# Patient Record
Sex: Male | Born: 1975 | Race: White | Hispanic: No | Marital: Married | Smoking: Never smoker
Health system: Southern US, Community
[De-identification: ages and names within clinical notes are randomized; demographics above are authoritative.]

## PROBLEM LIST (undated history)

## (undated) DIAGNOSIS — G43909 Migraine, unspecified, not intractable, without status migrainosus: Secondary | ICD-10-CM

## (undated) HISTORY — DX: Migraine, unspecified, not intractable, without status migrainosus: G43.909

## (undated) HISTORY — PX: NO PAST SURGERIES: SHX2092

---

## 2019-06-26 ENCOUNTER — Ambulatory Visit (INDEPENDENT_AMBULATORY_CARE_PROVIDER_SITE_OTHER): Payer: 59 | Admitting: Physician Assistant

## 2019-06-26 ENCOUNTER — Other Ambulatory Visit: Payer: Self-pay

## 2019-06-26 ENCOUNTER — Encounter: Payer: Self-pay | Admitting: Physician Assistant

## 2019-06-26 VITALS — BP 122/84 | HR 76 | Temp 97.7°F | Ht 67.0 in | Wt 167.8 lb

## 2019-06-26 DIAGNOSIS — Z136 Encounter for screening for cardiovascular disorders: Secondary | ICD-10-CM | POA: Diagnosis not present

## 2019-06-26 DIAGNOSIS — G43809 Other migraine, not intractable, without status migrainosus: Secondary | ICD-10-CM | POA: Diagnosis not present

## 2019-06-26 DIAGNOSIS — M546 Pain in thoracic spine: Secondary | ICD-10-CM

## 2019-06-26 DIAGNOSIS — G8929 Other chronic pain: Secondary | ICD-10-CM | POA: Diagnosis not present

## 2019-06-26 DIAGNOSIS — Z114 Encounter for screening for human immunodeficiency virus [HIV]: Secondary | ICD-10-CM

## 2019-06-26 DIAGNOSIS — Z13220 Encounter for screening for lipoid disorders: Secondary | ICD-10-CM | POA: Diagnosis not present

## 2019-06-26 DIAGNOSIS — G43909 Migraine, unspecified, not intractable, without status migrainosus: Secondary | ICD-10-CM | POA: Insufficient documentation

## 2019-06-26 DIAGNOSIS — Z0000 Encounter for general adult medical examination without abnormal findings: Secondary | ICD-10-CM

## 2019-06-26 DIAGNOSIS — E663 Overweight: Secondary | ICD-10-CM | POA: Insufficient documentation

## 2019-06-26 MED ORDER — DICLOFENAC SODIUM 75 MG PO TBEC
75.0000 mg | DELAYED_RELEASE_TABLET | Freq: Two times a day (BID) | ORAL | 1 refills | Status: DC
Start: 2019-06-26 — End: 2020-07-08

## 2019-06-26 MED ORDER — RIZATRIPTAN BENZOATE 10 MG PO TABS
10.0000 mg | ORAL_TABLET | ORAL | 2 refills | Status: DC | PRN
Start: 2019-06-26 — End: 2020-02-26

## 2019-06-26 NOTE — Patient Instructions (Signed)
It was great to see you!  Rizatriptan and diclofenac have been sent to the pharmacy.  Please go to the lab for blood work.   Our office will call you with your results unless you have chosen to receive results via MyChart.  If your blood work is normal we will follow-up each year for physicals and as scheduled for chronic medical problems.  If anything is abnormal we will treat accordingly and get you in for a follow-up.  Take care,  [facility] Maintenance, Male Adopting a healthy lifestyle and getting preventive care are important in promoting health and wellness. Ask your health care provider about:  The right schedule for you to have regular tests and exams.  Things you can do on your own to prevent diseases and keep yourself healthy. What should I know about diet, weight, and exercise? Eat a healthy diet   Eat a diet that includes plenty of vegetables, fruits, low-fat dairy products, and lean protein.  Do not eat a lot of foods that are high in solid fats, added sugars, or sodium. Maintain a healthy weight Body mass index (BMI) is a measurement that can be used to identify possible weight problems. It estimates body fat based on height and weight. Your health care provider can help determine your BMI and help you achieve or maintain a healthy weight. Get regular exercise Get regular exercise. This is one of the most important things you can do for your health. Most adults should:  Exercise for at least 150 minutes each week. The exercise should increase your heart rate and make you sweat (moderate-intensity exercise).  Do strengthening exercises at least twice a week. This is in addition to the moderate-intensity exercise.  Spend less time sitting. Even light physical activity can be beneficial. Watch cholesterol and blood lipids Have your blood tested for lipids and cholesterol at 43 years of age, then have this test every 5 years. You may need to have your  cholesterol levels checked more often if:  Your lipid or cholesterol levels are high.  You are older than 43 years of age.  You are at high risk for heart disease. What should I know about cancer screening? Many types of cancers can be detected early and may often be prevented. Depending on your health history and family history, you may need to have cancer screening at various ages. This may include screening for:  Colorectal cancer.  Prostate cancer.  Skin cancer.  Lung cancer. What should I know about heart disease, diabetes, and high blood pressure? Blood pressure and heart disease  High blood pressure causes heart disease and increases the risk of stroke. This is more likely to develop in people who have high blood pressure readings, are of African descent, or are overweight.  Talk with your health care provider about your target blood pressure readings.  Have your blood pressure checked: ? Every 3-5 years if you are 8-54 years of age. ? Every year if you are 28 years old or older.  If you are between the ages of 84 and 5 and are a current or former smoker, ask your health care provider if you should have a one-time screening for abdominal aortic aneurysm (AAA). Diabetes Have regular diabetes screenings. This checks your fasting blood sugar level. Have the screening done:  Once every three years after age 9 if you are at a normal weight and have a low risk for diabetes.  More often and at a younger  age if you are overweight or have a high risk for diabetes. What should I know about preventing infection? Hepatitis B If you have a higher risk for hepatitis B, you should be screened for this virus. Talk with your health care provider to find out if you are at risk for hepatitis B infection. Hepatitis C Blood testing is recommended for:  Everyone born from [birth year] through 1964.  Anyone with known risk factors for hepatitis C. Sexually transmitted infections (STIs)  You  should be screened each year for STIs, including gonorrhea and chlamydia, if: ? You are sexually active and are younger than 43 years of age. ? You are older than 43 years of age and your health care provider tells you that you are at risk for this type of infection. ? Your sexual activity has changed since you were last screened, and you are at increased risk for chlamydia or gonorrhea. Ask your health care provider if you are at risk.  Ask your health care provider about whether you are at high risk for HIV. Your health care provider may recommend a prescription medicine to help prevent HIV infection. If you choose to take medicine to prevent HIV, you should first get tested for HIV. You should then be tested every 3 months for as long as you are taking the medicine. Follow these instructions at home: Lifestyle  Do not use any products that contain nicotine or tobacco, such as cigarettes, e-cigarettes, and chewing tobacco. If you need help quitting, ask your health care provider.  Do not use street drugs.  Do not share needles.  Ask your health care provider for help if you need support or information about quitting drugs. Alcohol use  Do not drink alcohol if your health care provider tells you not to drink.  If you drink alcohol: ? Limit how much you have to 0-2 drinks a day. ? Be aware of how much alcohol is in your drink. In the [place] one drink equals one 12 oz bottle of beer (355 mL), one 5 oz glass of wine (148 mL), or one 1 oz glass of hard liquor (44 mL). General instructions  Schedule regular health, dental, and eye exams.  Stay current with your vaccines.  Tell your health care provider if: ? You often feel depressed. ? You have ever been abused or do not feel safe at home. Summary  Adopting a healthy lifestyle and getting preventive care are important in promoting health and wellness.  Follow your health care provider's instructions about healthy diet, exercising, and  getting tested or screened for diseases.  Follow your health care provider's instructions on monitoring your cholesterol and blood pressure. This information is not intended to replace advice given to you by your health care provider. Make sure you discuss any questions you have with your health care provider. Document Revised: 01/31/2018 Document Reviewed: 01/31/2018 Elsevier Patient Education  2020 [employer].

## 2019-06-26 NOTE — Progress Notes (Signed)
SCRIBE STATEMENT  Subjective:    Antonio Santiago is a 44 y.o. male and is here for a comprehensive physical exam.  HPI  Health Maintenance Due  Topic Date Due  . HIV Screening  Never done    Acute Concerns: None  Chronic Issues: Migraines -- has had issues since a child. Most recent rx from [place] was a -triptan, and he thinks that it may be rizatriptan. Takes this medication rarely. Sometimes finds that if he takes this medication too often can cause worsening symptoms so sometimes he doesn't take any medication. Denies changes in vision, numbness, tingling. Back pain -- long history of this. Sometimes gets b/l sciatic pain. Was getting series of injections in [place] that helped significantly. Will occassionally take voltaren orally.  Health Maintenance: Immunizations -- UTD Diet -- healthy diet Caffeine intake -- 3 coffees daily Sleep habits -- good sleep Exercise -- started to do exercise regularly with wife Weight -- Weight: 167 lb 12.8 oz (76.1 kg)  Normal weight for him Weight history Wt Readings from Last 10 Encounters:  06/26/19 167 lb 12.8 oz (76.1 kg)  Body mass index is 26.28 kg/m. Mood -- stable Tobacco use -- none Alcohol use --- rare  Depression screen PHQ 2/9 06/26/2019  Decreased Interest 0  Down, Depressed, Hopeless 0  PHQ - 2 Score 0     Other providers/specialists: Patient Care Team: Jarold Motto, [place] as PCP - General (Physician Assistant)   PMHx, SurgHx, SocialHx, Medications, and Allergies were reviewed in the Visit Navigator and updated as appropriate.   History reviewed. No pertinent past medical history.  History reviewed. No pertinent surgical history.   Family History  Problem Relation Age of Onset  . Esophageal cancer Father   . Stomach cancer Father     Social History   Tobacco Use  . Smoking status: Never Smoker  . Smokeless tobacco: Never Used  Substance Use Topics  . Alcohol use: Yes    Comment: very rare  . Drug use:  Not on file    Review of Systems:   Review of Systems  Constitutional: Negative for chills, fever, malaise/fatigue and weight loss.  HENT: Negative for hearing loss, sinus pain and sore throat.   Respiratory: Negative for cough and hemoptysis.   Cardiovascular: Negative for chest pain, palpitations, leg swelling and PND.  Gastrointestinal: Negative for abdominal pain, constipation, diarrhea, heartburn, nausea and vomiting.  Genitourinary: Negative for dysuria, frequency and urgency.  Musculoskeletal: Negative for back pain, myalgias and neck pain.  Skin: Negative for itching and rash.  Neurological: Negative for dizziness, tingling, seizures and headaches.  Endo/Heme/Allergies: Negative for polydipsia.  Psychiatric/Behavioral: Negative for depression. The patient is not nervous/anxious.       Objective:   Vitals:   06/26/19 1510  BP: 122/84  Pulse: 76  Temp: 97.7 F (36.5 C)  SpO2: 98%   Body mass index is 26.28 kg/m.  General Appearance:  Alert, cooperative, no distress, appears stated age  Head:  Normocephalic, without obvious abnormality, atraumatic  Eyes:  PERRL, conjunctiva/corneas clear, EOM's intact, fundi benign, both eyes       Ears:  Normal TM's and external ear canals, both ears  Nose: Nares normal, septum midline, mucosa normal, no drainage    or sinus tenderness  Throat: Lips, mucosa, and tongue normal; teeth and gums normal  Neck: Supple, symmetrical, trachea midline, no adenopathy; thyroid:  No enlargement/tenderness/nodules; no carotit bruit or JVD  Back:   Symmetric, no curvature, ROM normal, no CVA tenderness  Lungs:   Clear to auscultation bilaterally, respirations unlabored  Chest wall:  No tenderness or deformity  Heart:  Regular rate and rhythm, S1 and S2 normal, no murmur, rub   or gallop  Abdomen:   Soft, non-tender, bowel sounds active all four quadrants, no masses, no organomegaly  Extremities: Extremities normal, atraumatic, no cyanosis or edema   Prostate: Not done.   Skin: Skin color, texture, turgor normal, no rashes or lesions  Lymph nodes: Cervical, supraclavicular, and axillary nodes normal  Neurologic: CNII-XII grossly intact. Normal strength, sensation and reflexes throughout   No results found for this or any previous visit.  Assessment/Plan:   Antonio Santiago was seen today for new patient (initial visit).  Diagnoses and all orders for this visit:  Routine physical examination Today patient counseled on age appropriate routine health concerns for screening and prevention, each reviewed and up to date or declined. Immunizations reviewed and up to date or declined. Labs ordered and reviewed. Risk factors for depression reviewed and negative. Hearing function and visual acuity are intact. ADLs screened and addressed as needed. Functional ability and level of safety reviewed and appropriate. Education, counseling and referrals performed based on assessed risks today. Patient provided with a copy of personalized plan for preventive services. -     Urinalysis, Routine w reflex microscopic  Encounter for lipid screening for cardiovascular disease -     Lipid panel  Other migraine without status migrainosus, not intractable Denies any significant changes to his migraines. Will refill his rizatriptan. Follow-up if becomes uncontrolled.  Chronic midline thoracic back pain No red flags. Denies any significant changes to his symptoms. Will refill voltaren. -     CBC with Differential/Platelet -     Comprehensive metabolic panel -     TSH -     Urinalysis, Routine w reflex microscopic  Screening for HIV (human immunodeficiency virus) -     HIV Antibody (routine testing w rflx)  Overweight Work on diet and exercise as able.  Other orders -     rizatriptan (MAXALT) 10 MG tablet; Take 1 tablet (10 mg total) by mouth as needed for migraine. May repeat in 2 hours if needed -     diclofenac (VOLTAREN) 75 MG EC tablet; Take 1 tablet (75  mg total) by mouth 2 (two) times daily.   Well Adult Exam: Labs ordered: Yes. Patient counseling was done. See below for items discussed. Discussed the patient's BMI.  The BMI is not in the acceptable range; BMI management plan is completed Follow up in one year.  Patient Counseling: [x]   Nutrition: Stressed importance of moderation in sodium/caffeine intake, saturated fat and cholesterol, caloric balance, sufficient intake of fresh fruits, vegetables, and fiber.  [x]   Stressed the importance of regular exercise.   []   Substance Abuse: Discussed cessation/primary prevention of tobacco, alcohol, or other drug use; driving or other dangerous activities under the influence; availability of treatment for abuse.   [x]   Injury prevention: Discussed safety belts, safety helmets, smoke detector, smoking near bedding or upholstery.   []   Sexuality: Discussed sexually transmitted diseases, partner selection, use of condoms, avoidance of unintended pregnancy  and contraceptive alternatives.   [x]   Dental health: Discussed importance of regular tooth brushing, flossing, and dental visits.  [x]   Health maintenance and immunizations reviewed. Please refer to Health maintenance section.    CMA or LPN served as scribe during this visit. History, Physical, and Plan performed by medical provider. The above documentation has been reviewed and  is accurate and complete.  Jarold Motto, PA-C [facility]

## 2019-06-27 LAB — CBC WITH DIFFERENTIAL/PLATELET
Basophils Absolute: 0.1 10*3/uL (ref 0.0–0.1)
Basophils Relative: 1.3 % (ref 0.0–3.0)
Eosinophils Absolute: 0.1 10*3/uL (ref 0.0–0.7)
Eosinophils Relative: 0.7 % (ref 0.0–5.0)
HCT: 43.9 % (ref 39.0–52.0)
Hemoglobin: 15.6 g/dL (ref 13.0–17.0)
Lymphocytes Relative: 33.5 % (ref 12.0–46.0)
Lymphs Abs: 2.8 10*3/uL (ref 0.7–4.0)
MCHC: 35.6 g/dL (ref 30.0–36.0)
MCV: 89.9 fl (ref 78.0–100.0)
Monocytes Absolute: 0.7 10*3/uL (ref 0.1–1.0)
Monocytes Relative: 7.9 % (ref 3.0–12.0)
Neutro Abs: 4.6 10*3/uL (ref 1.4–7.7)
Neutrophils Relative %: 56.6 % (ref 43.0–77.0)
Platelets: 236 10*3/uL (ref 150.0–400.0)
RBC: 4.88 Mil/uL (ref 4.22–5.81)
RDW: 12.9 % (ref 11.5–15.5)
WBC: 8.2 10*3/uL (ref 4.0–10.5)

## 2019-06-27 LAB — COMPREHENSIVE METABOLIC PANEL
ALT: 14 U/L (ref 0–53)
AST: 15 U/L (ref 0–37)
Albumin: 4.6 g/dL (ref 3.5–5.2)
Alkaline Phosphatase: 64 U/L (ref 39–117)
BUN: 12 mg/dL (ref 6–23)
CO2: 30 mEq/L (ref 19–32)
Calcium: 9.4 mg/dL (ref 8.4–10.5)
Chloride: 103 mEq/L (ref 96–112)
Creatinine, Ser: 0.89 mg/dL (ref 0.40–1.50)
GFR: 93.33 mL/min (ref >60.00)
Glucose, Bld: 93 mg/dL (ref 70–99)
Potassium: 4.3 mEq/L (ref 3.5–5.1)
Sodium: 138 mEq/L (ref 135–145)
Total Bilirubin: 0.7 mg/dL (ref 0.2–1.2)
Total Protein: 7.3 g/dL (ref 6.0–8.3)

## 2019-06-27 LAB — LIPID PANEL
Cholesterol: 174 mg/dL (ref 0–200)
HDL: 41.8 mg/dL (ref >39.00)
NonHDL: 132.05
Total CHOL/HDL Ratio: 4
Triglycerides: 225 mg/dL — ABNORMAL HIGH (ref 0.0–149.0)
VLDL: 45 mg/dL — ABNORMAL HIGH (ref 0.0–40.0)

## 2019-06-27 LAB — URINALYSIS, ROUTINE W REFLEX MICROSCOPIC
Bilirubin Urine: NEGATIVE
Hgb urine dipstick: NEGATIVE
Ketones, ur: NEGATIVE
Leukocytes,Ua: NEGATIVE
Nitrite: NEGATIVE
RBC / HPF: NONE SEEN (ref 0–?)
Specific Gravity, Urine: 1.015 (ref 1.000–1.030)
Total Protein, Urine: NEGATIVE
Urine Glucose: NEGATIVE
Urobilinogen, UA: 0.2 (ref 0.0–1.0)
WBC, UA: NONE SEEN (ref 0–?)
pH: 7.5 (ref 5.0–8.0)

## 2019-06-27 LAB — TSH: TSH: 0.65 u[IU]/mL (ref 0.35–4.50)

## 2019-06-27 LAB — HIV ANTIBODY (ROUTINE TESTING W REFLEX): HIV 1&2 Ab, 4th Generation: NONREACTIVE

## 2019-06-27 LAB — LDL CHOLESTEROL, DIRECT: Direct LDL: 94 mg/dL

## 2020-01-14 ENCOUNTER — Ambulatory Visit: Payer: 59 | Admitting: Physician Assistant

## 2020-01-14 DIAGNOSIS — Z0289 Encounter for other administrative examinations: Secondary | ICD-10-CM

## 2020-01-14 NOTE — Progress Notes (Deleted)
Antonio Santiago is a 44 y.o. male here for a new problem.  I acted as a Neurosurgeon for [employer], PA-C [employer], LPN   History of Present Illness:   No chief complaint on file.   HPI Back pain    No past medical history on file.   Social History   Tobacco Use  . Smoking status: Never Smoker  . Smokeless tobacco: Never Used  Substance Use Topics  . Alcohol use: Yes    Comment: very rare  . Drug use: Not on file    No past surgical history on file.  Family History  Problem Relation Age of Onset  . Esophageal cancer Father   . Stomach cancer Father     No Known Allergies  Current Medications:   Current Outpatient Medications:  .  diclofenac (VOLTAREN) 75 MG EC tablet, Take 1 tablet (75 mg total) by mouth 2 (two) times daily., Disp: 30 tablet, Rfl: 1 .  rizatriptan (MAXALT) 10 MG tablet, Take 1 tablet (10 mg total) by mouth as needed for migraine. May repeat in 2 hours if needed, Disp: 10 tablet, Rfl: 2   Review of Systems:   ROS  Vitals:   There were no vitals filed for this visit.   There is no height or weight on file to calculate BMI.  Physical Exam:   Physical Exam  Results for orders placed or performed in visit on 06/26/19  CBC with Differential/Platelet  Result Value Ref Range   WBC 8.2 4.0 - 10.5 K/uL   RBC 4.88 4.22 - 5.81 Mil/uL   Hemoglobin 15.6 13.0 - 17.0 g/dL   HCT 42.3 39 - 52 %   MCV 89.9 78.0 - 100.0 fl   MCHC 35.6 30.0 - 36.0 g/dL   RDW 53.6 14.4 - 31.5 %   Platelets 236.0 150 - 400 K/uL   Neutrophils Relative % 56.6 43 - 77 %   Lymphocytes Relative 33.5 12 - 46 %   Monocytes Relative 7.9 3 - 12 %   Eosinophils Relative 0.7 0 - 5 %   Basophils Relative 1.3 0 - 3 %   Neutro Abs 4.6 1.4 - 7.7 K/uL   Lymphs Abs 2.8 0.7 - 4.0 K/uL   Monocytes Absolute 0.7 0.1 - 1.0 K/uL   Eosinophils Absolute 0.1 0.0 - 0.7 K/uL   Basophils Absolute 0.1 0.0 - 0.1 K/uL  Comprehensive metabolic panel  Result Value Ref Range   Sodium 138  135 - 145 mEq/L   Potassium 4.3 3.5 - 5.1 mEq/L   Chloride 103 96 - 112 mEq/L   CO2 30 19 - 32 mEq/L   Glucose, Bld 93 70 - 99 mg/dL   BUN 12 6 - 23 mg/dL   Creatinine, Ser 4.00 0.40 - 1.50 mg/dL   Total Bilirubin 0.7 0.2 - 1.2 mg/dL   Alkaline Phosphatase 64 39 - 117 U/L   AST 15 0 - 37 U/L   ALT 14 0 - 53 U/L   Total Protein 7.3 6.0 - 8.3 g/dL   Albumin 4.6 3.5 - 5.2 g/dL   GFR 86.76 >19.50 mL/min   Calcium 9.4 8.4 - 10.5 mg/dL  TSH  Result Value Ref Range   TSH 0.65 0.35 - 4.50 uIU/mL  Lipid panel  Result Value Ref Range   Cholesterol 174 0 - 200 mg/dL   Triglycerides 932.6 (H) 0 - 149 mg/dL   HDL 71.24 >58.09 mg/dL   VLDL 98.3 (H) 0.0 - 38.2 mg/dL  Total CHOL/HDL Ratio 4    NonHDL 132.05   Urinalysis, Routine w reflex microscopic  Result Value Ref Range   Color, Urine YELLOW Yellow;Lt. Yellow;Straw;Dark Yellow;Amber;Green;Red;Brown   APPearance CLEAR Clear;Turbid;Slightly Cloudy;Cloudy   Specific Gravity, Urine 1.015 1.000 - 1.030   pH 7.5 5.0 - 8.0   Total Protein, Urine NEGATIVE Negative   Urine Glucose NEGATIVE Negative   Ketones, ur NEGATIVE Negative   Bilirubin Urine NEGATIVE Negative   Hgb urine dipstick NEGATIVE Negative   Urobilinogen, UA 0.2 0.0 - 1.0   Leukocytes,Ua NEGATIVE Negative   Nitrite NEGATIVE Negative   WBC, UA none seen 0-2/hpf   RBC / HPF none seen 0-2/hpf  HIV Antibody (routine testing w rflx)  Result Value Ref Range   HIV 1&2 Ab, 4th Generation NON-REACTIVE NON-REACTI  LDL cholesterol, direct  Result Value Ref Range   Direct LDL 94.0 mg/dL    Assessment and Plan:   There are no diagnoses linked to this encounter.  . Reviewed expectations re: course of current medical issues. . Discussed self-management of symptoms. . Outlined signs and symptoms indicating need for more acute intervention. . Patient verbalized understanding and all questions were answered. . See orders for this visit as documented in the electronic medical  record. . Patient received an After-Visit Summary.  ***  Jarold Motto, PA-C

## 2020-01-21 ENCOUNTER — Encounter: Payer: Self-pay | Admitting: Physician Assistant

## 2020-01-21 ENCOUNTER — Ambulatory Visit (INDEPENDENT_AMBULATORY_CARE_PROVIDER_SITE_OTHER): Payer: 59 | Admitting: Physician Assistant

## 2020-01-21 ENCOUNTER — Other Ambulatory Visit: Payer: Self-pay

## 2020-01-21 VITALS — BP 121/82 | HR 77 | Temp 97.6°F | Ht 67.0 in | Wt 171.4 lb

## 2020-01-21 DIAGNOSIS — M549 Dorsalgia, unspecified: Secondary | ICD-10-CM | POA: Diagnosis not present

## 2020-01-21 DIAGNOSIS — R42 Dizziness and giddiness: Secondary | ICD-10-CM

## 2020-01-21 DIAGNOSIS — G8929 Other chronic pain: Secondary | ICD-10-CM

## 2020-01-21 MED ORDER — PREDNISONE 20 MG PO TABS: 40.0000 mg | ORAL_TABLET | Freq: Every day | ORAL | 0 refills | Status: DC

## 2020-01-21 NOTE — Progress Notes (Signed)
Antonio Santiago is a 44 y.o. male here for a new problem.  History of Present Illness:   Chief Complaint  Patient presents with   Back Pain    Pt says that he had had back pain before, and had restarted 3-5 days ago. he has taken Voltaren tab atleast 30 minutes ago. He says it is not helping.   Dizziness    Pt says that when he lays down, the room spins. Starting a couple of weeks ago.    HPI  Chronic back pain Patient reports that about 7 years ago in [place] he had MRI of complete spine that showed possible herniated disc in cervical, thoracic and lumbar spine. Underwent spinal injections and pain has been overall well controlled since that time. However, about 3-5 days ago his pain has returned. It is mostly in lower back, R side radiating down leg. He denies: fever, chills, weakness, numbness/tingling, bowel/bladder incontinence. He has tried topical voltaren and advil with mild relief. He is also seeing a chiropractor for laser therapy. Current pain is 8/10.  Vertigo A couple weeks ago he started having sensation of the room spinning when laying down and turning his head to the right. Denies: vision changes, nausea, vomiting, head trauma. This has happened to him in the past and he went to PT and had exercises and resolution of symptoms.    History reviewed. No pertinent past medical history.   Social History   Tobacco Use   Smoking status: Never Smoker   Smokeless tobacco: Never Used  Substance Use Topics   Alcohol use: Yes    Comment: very rare   Drug use: Not on file    History reviewed. No pertinent surgical history.  Family History  Problem Relation Age of Onset   Esophageal cancer Father    Stomach cancer Father     No Known Allergies  Current Medications:   Current Outpatient Medications:    diclofenac (VOLTAREN) 75 MG EC tablet, Take 1 tablet (75 mg total) by mouth 2 (two) times daily., Disp: 30 tablet, Rfl: 1   rizatriptan (MAXALT) 10 MG tablet,  Take 1 tablet (10 mg total) by mouth as needed for migraine. May repeat in 2 hours if needed, Disp: 10 tablet, Rfl: 2   predniSONE (DELTASONE) 20 MG tablet, Take 2 tablets (40 mg total) by mouth daily., Disp: 10 tablet, Rfl: 0   Review of Systems:   ROS  Negative unless otherwise specified per HPI.  Vitals:   Vitals:   01/21/20 1446  BP: 121/82  Pulse: 77  Temp: 97.6 F (36.4 C)  TempSrc: Temporal  SpO2: 100%  Weight: 171 lb 6.4 oz (77.7 kg)  Height: 5\' 7"  (1.702 m)     Body mass index is 26.85 kg/m.  Physical Exam:   Physical Exam Vitals and nursing note reviewed.  Constitutional:      General: He is not in acute distress.    Appearance: He is well-developed. He is not ill-appearing or toxic-appearing.  Cardiovascular:     Rate and Rhythm: Normal rate and regular rhythm.     Pulses: Normal pulses.     Heart sounds: Normal heart sounds, S1 normal and S2 normal.     Comments: No LE edema Pulmonary:     Effort: Pulmonary effort is normal.     Breath sounds: Normal breath sounds.  Musculoskeletal:     Comments: Decreased ROM 2/2 pain with flexion/extension, lateral side bends, and rotation. Reproducible tenderness with deep palpation to bilateral  paraspinal muscles. Bony tenderness to middle thoracic. No evidence of erythema, rash or ecchymosis.    Skin:    General: Skin is warm and dry.  Neurological:     Mental Status: He is alert.     GCS: GCS eye subscore is 4. GCS verbal subscore is 5. GCS motor subscore is 6.     Sensory: Sensation is intact.     Motor: Motor function is intact.     Coordination: Coordination is intact.     Comments: Grip strength 5/5  Psychiatric:        Speech: Speech normal.        Behavior: Behavior normal. Behavior is cooperative.       Assessment and Plan:   Dencil was seen today for back pain and dizziness.  Diagnoses and all orders for this visit:  Chronic back pain, unspecified back location, unspecified back pain  laterality No red flags on exam today. 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 Start oral prednisone and take as indicated. MRI per patient request. Recommend ER evaluation if symptoms worsen in the meantime. -     MR Cervical Spine Wo Contrast; Future -     MR Lumbar Spine Wo Contrast; Future -     MR Thoracic Spine Wo Contrast; Future  Vertigo Normal neuro exam. Epley maneuver handout provided. If no improvement of symptoms, will refer to vestibular rehab.  Other orders -     predniSONE (DELTASONE) 20 MG tablet; Take 2 tablets (40 mg total) by mouth daily.  CMA or LPN served as scribe during this visit. History, Physical, and Plan performed by medical provider. The above documentation has been reviewed and is accurate and complete.   Jarold Motto, PA-C

## 2020-01-21 NOTE — Patient Instructions (Signed)
It was great to see you!  For your back -- you can take the oral prednisone to see if this helps your symptoms. This is an anti-inflammatory.  You will be contacted about scheduling your MRI.  Please let me know the reason why you are wanting and abdominal ultrasound. I am happy to order this, I just need to know why.  Try the exercises for your dizziness, if no improvement, let me know and we will get you to someone who can help you do them.  Take care,  Jarold Motto PA-C

## 2020-02-09 ENCOUNTER — Ambulatory Visit
Admission: RE | Admit: 2020-02-09 | Discharge: 2020-02-09 | Disposition: A | Discharge status: Home / Self Care | Payer: 59 | Source: Ambulatory Visit | Attending: Physician Assistant | Admitting: Physician Assistant

## 2020-02-09 ENCOUNTER — Other Ambulatory Visit: Payer: Self-pay

## 2020-02-09 DIAGNOSIS — M549 Dorsalgia, unspecified: Secondary | ICD-10-CM

## 2020-02-09 DIAGNOSIS — G8929 Other chronic pain: Secondary | ICD-10-CM

## 2020-02-26 ENCOUNTER — Encounter: Payer: Self-pay | Admitting: Physician Assistant

## 2020-02-26 MED ORDER — RIZATRIPTAN BENZOATE 10 MG PO TABS: 10.0000 mg | ORAL_TABLET | ORAL | 2 refills | Status: DC | PRN

## 2020-02-28 NOTE — Telephone Encounter (Signed)
Call patient, patient was asking for prices on medication  Patient aware he has to ask pharmacy for prices, gave information to [facility].

## 2020-04-20 ENCOUNTER — Encounter: Payer: Self-pay | Admitting: Physician Assistant

## 2020-04-20 ENCOUNTER — Ambulatory Visit (INDEPENDENT_AMBULATORY_CARE_PROVIDER_SITE_OTHER): Payer: 59 | Admitting: Physician Assistant

## 2020-04-20 ENCOUNTER — Other Ambulatory Visit: Payer: Self-pay

## 2020-04-20 VITALS — BP 100/64 | HR 79 | Temp 98.3°F | Ht 67.0 in | Wt 165.4 lb

## 2020-04-20 DIAGNOSIS — G8929 Other chronic pain: Secondary | ICD-10-CM | POA: Diagnosis not present

## 2020-04-20 DIAGNOSIS — M549 Dorsalgia, unspecified: Secondary | ICD-10-CM

## 2020-04-20 DIAGNOSIS — E663 Overweight: Secondary | ICD-10-CM

## 2020-04-20 DIAGNOSIS — E785 Hyperlipidemia, unspecified: Secondary | ICD-10-CM

## 2020-04-20 LAB — POC URINALSYSI DIPSTICK (AUTOMATED)
Bilirubin, UA: NEGATIVE
Blood, UA: NEGATIVE
Glucose, UA: NEGATIVE
Ketones, UA: NEGATIVE
Leukocytes, UA: NEGATIVE
Nitrite, UA: NEGATIVE
Protein, UA: NEGATIVE
Spec Grav, UA: 1.015 (ref 1.010–1.025)
Urobilinogen, UA: 0.2 E.U./dL
pH, UA: 7.5 (ref 5.0–8.0)

## 2020-04-20 NOTE — Patient Instructions (Signed)
It was great to see you!  I'm going to put in a referral for you to see a neurosurgeon for your neck.  We will update your blood work today and we will be in touch with results.  Take care,  Jarold Motto PA-C

## 2020-04-20 NOTE — Progress Notes (Signed)
Antonio Santiago is a 45 y.o. male is here to discuss: MRI  I acted as a Neurosurgeon for [employer], PA-C Corky Mull, LPN   History of Present Illness:   Chief Complaint  Patient presents with  . Back Pain    HPI   Back pain Pt here to discuss MRI results of back. Pt is having some discomfort right lower back. Not taking anything for pain at present. Denies: loss of bowel/bladder function, saddle anesthesia. Did two rounds of laser therapy with a chiropractor with minimal relief of symptoms. Had ozone therapy in [place] and would like to have this done.  MRI performed on 02/09/20: Cervical --  IMPRESSION: 1. Right eccentric disc bulge with uncovertebral hypertrophy at C6-7 with resultant mild canal, with moderate right C7 foraminal stenosis. Finding could contribute to right upper extremity radicular symptoms. 2. Left paracentral disc protrusion at C5-6 contacting and flattening the left hemi cord, with resultant mild canal and moderate left C6 foraminal stenosis. 3. Small central disc protrusions at C3-4 and C4-5 with resultant mild spinal stenosis.  Thoracic -- IMPRESSION: 1. Small right paracentral to foraminal disc protrusions at T5-6 and T7-8 without significant stenosis or neural impingement. 2. Additional minimal degenerative disc bulging elsewhere within the thoracic spine as above without significant spinal stenosis or cord deformity. No significant foraminal encroachment.  Lumbar --  IMPRESSION: 1. Mild reactive endplate change about the L1-2 space anteriorly, which could contribute to underlying back pain. 2. Otherwise essentially normal MRI of the lumbar spine for age. No significant disc pathology, stenosis, or evidence for neural impingement.  Health Maintenance Due  Topic Date Due  . Hepatitis C Screening  Never done  . COVID-19 Vaccine (1) Never done    HLD Would like his lipid panel rechecked today.   Overweight Continues to work on healthy  lifestyle. Reports that he tries to keep his lifestyle active but is sometimes unable to given back pain and work schedule. Would like his routine labs checked today.   History reviewed. No pertinent past medical history.   Social History   Tobacco Use  . Smoking status: Never Smoker  . Smokeless tobacco: Never Used  Substance Use Topics  . Alcohol use: Yes    Comment: very rare    History reviewed. No pertinent surgical history.  Family History  Problem Relation Age of Onset  . Esophageal cancer Father   . Stomach cancer Father     PMHx, SurgHx, SocialHx, FamHx, Medications, and Allergies were reviewed in the Visit Navigator and updated as appropriate.   Patient Active Problem List   Diagnosis Date Noted  . Migraine 06/26/2019  . Chronic midline thoracic back pain 06/26/2019  . Overweight 06/26/2019    Social History   Tobacco Use  . Smoking status: Never Smoker  . Smokeless tobacco: Never Used  Substance Use Topics  . Alcohol use: Yes    Comment: very rare    Current Medications and Allergies:    Current Outpatient Medications:  .  diclofenac (VOLTAREN) 75 MG EC tablet, Take 1 tablet (75 mg total) by mouth 2 (two) times daily. (Patient not taking: Reported on 04/20/2020), Disp: 30 tablet, Rfl: 1 .  rizatriptan (MAXALT) 10 MG tablet, Take 1 tablet (10 mg total) by mouth as needed for migraine. May repeat in 2 hours if needed (Patient not taking: Reported on 04/20/2020), Disp: 10 tablet, Rfl: 2  No Known Allergies  Review of Systems   ROS Negative unless otherwise specified per HPI.  Vitals:   Vitals:   04/20/20 1518  BP: 100/64  Pulse: 79  Temp: 98.3 F (36.8 C)  TempSrc: Temporal  SpO2: 96%  Weight: 165 lb 6.1 oz (75 kg)  Height: 5\' 7"  (1.702 m)     Body mass index is 25.9 kg/m.   Physical Exam:    Physical Exam Vitals and nursing note reviewed.  Constitutional:      General: He is not in acute distress.    Appearance: He is  well-developed. He is not ill-appearing, toxic-appearing or sickly-appearing.  Cardiovascular:     Rate and Rhythm: Normal rate and regular rhythm.     Pulses: Normal pulses.     Heart sounds: Normal heart sounds, S1 normal and S2 normal.     Comments: No LE edema Pulmonary:     Effort: Pulmonary effort is normal.     Breath sounds: Normal breath sounds.  Skin:    General: Skin is warm, dry and intact.  Neurological:     Mental Status: He is alert.     GCS: GCS eye subscore is 4. GCS verbal subscore is 5. GCS motor subscore is 6.  Psychiatric:        Mood and Affect: Mood and affect normal.        Speech: Speech normal.        Behavior: Behavior normal. Behavior is cooperative.    Results for orders placed or performed in visit on 04/20/20  POCT Urinalysis Dipstick (Automated)  Result Value Ref Range   Color, UA yellow    Clarity, UA clear    Glucose, UA Negative Negative   Bilirubin, UA negative    Ketones, UA negative    Spec Grav, UA 1.015 1.010 - 1.025   Blood, UA negative    pH, UA 7.5 5.0 - 8.0   Protein, UA Negative Negative   Urobilinogen, UA 0.2 0.2 or 1.0 E.U./dL   Nitrite, UA negative    Leukocytes, UA Negative Negative     Assessment and Plan:    Danne was seen today for back pain.  Diagnoses and all orders for this visit:  Chronic back pain, unspecified back location, unspecified back pain laterality No red flags on exam. We reviewed his MRI results together. Referral to neurosurgery for further evaluation and management. Patient verbalized understanding, declines any needs today for pain or sx mgmt. -     Ambulatory referral to Neurosurgery  Overweight Continue to work towards healthy lifestyle. He is requesting blood work and urine testing today. States that he gets blood work routinely q 6 months in [place]. Follow-up based on results. -     CBC with Differential/Platelet -     Comprehensive metabolic panel -     TSH -     POCT Urinalysis  Dipstick (Automated)  Hyperlipidemia, unspecified hyperlipidemia type Update lipid panel today and provide recommendations as indicated. -     Lipid panel  CMA or LPN served as scribe during this visit. History, Physical, and Plan performed by medical provider. The above documentation has been reviewed and is accurate and complete.  [place], PA-C [place], [facility] 04/20/2020  Follow-up: No follow-ups on file.

## 2020-04-21 ENCOUNTER — Other Ambulatory Visit: Payer: Self-pay | Admitting: Physician Assistant

## 2020-04-21 ENCOUNTER — Encounter: Payer: Self-pay | Admitting: Physician Assistant

## 2020-04-21 DIAGNOSIS — E781 Pure hyperglyceridemia: Secondary | ICD-10-CM

## 2020-04-21 LAB — CBC WITH DIFFERENTIAL/PLATELET
Basophils Absolute: 0.1 10*3/uL (ref 0.0–0.1)
Basophils Relative: 1.2 % (ref 0.0–3.0)
Eosinophils Absolute: 0.1 10*3/uL (ref 0.0–0.7)
Eosinophils Relative: 1 % (ref 0.0–5.0)
HCT: 43.7 % (ref 39.0–52.0)
Hemoglobin: 15.5 g/dL (ref 13.0–17.0)
Lymphocytes Relative: 34.3 % (ref 12.0–46.0)
Lymphs Abs: 2.7 10*3/uL (ref 0.7–4.0)
MCHC: 35.4 g/dL (ref 30.0–36.0)
MCV: 90.2 fl (ref 78.0–100.0)
Monocytes Absolute: 0.5 10*3/uL (ref 0.1–1.0)
Monocytes Relative: 6.5 % (ref 3.0–12.0)
Neutro Abs: 4.4 10*3/uL (ref 1.4–7.7)
Neutrophils Relative %: 57 % (ref 43.0–77.0)
Platelets: 238 10*3/uL (ref 150.0–400.0)
RBC: 4.84 Mil/uL (ref 4.22–5.81)
RDW: 13.1 % (ref 11.5–15.5)
WBC: 7.8 10*3/uL (ref 4.0–10.5)

## 2020-04-21 LAB — COMPREHENSIVE METABOLIC PANEL
ALT: 15 U/L (ref 0–53)
AST: 16 U/L (ref 0–37)
Albumin: 4.4 g/dL (ref 3.5–5.2)
Alkaline Phosphatase: 61 U/L (ref 39–117)
BUN: 13 mg/dL (ref 6–23)
CO2: 31 mEq/L (ref 19–32)
Calcium: 9.2 mg/dL (ref 8.4–10.5)
Chloride: 103 mEq/L (ref 96–112)
Creatinine, Ser: 0.92 mg/dL (ref 0.40–1.50)
GFR: 101.71 mL/min (ref >60.00)
Glucose, Bld: 87 mg/dL (ref 70–99)
Potassium: 4 mEq/L (ref 3.5–5.1)
Sodium: 141 mEq/L (ref 135–145)
Total Bilirubin: 0.4 mg/dL (ref 0.2–1.2)
Total Protein: 6.9 g/dL (ref 6.0–8.3)

## 2020-04-21 LAB — LIPID PANEL
Cholesterol: 200 mg/dL (ref 0–200)
HDL: 40.3 mg/dL (ref >39.00)
Total CHOL/HDL Ratio: 5
Triglycerides: 455 mg/dL — ABNORMAL HIGH (ref 0.0–149.0)

## 2020-04-21 LAB — TSH: TSH: 0.77 u[IU]/mL (ref 0.35–4.50)

## 2020-04-21 LAB — LDL CHOLESTEROL, DIRECT: Direct LDL: 109 mg/dL

## 2020-04-23 NOTE — Telephone Encounter (Signed)
Left message on voicemail to call office.  

## 2020-04-27 NOTE — Telephone Encounter (Signed)
Seen by patient Antonio Santiago on 04/21/2020  8:47 PM. Pt reviewed message from [facility] regarding labs.

## 2020-05-12 DIAGNOSIS — E559 Vitamin D deficiency, unspecified: Secondary | ICD-10-CM | POA: Insufficient documentation

## 2020-05-15 ENCOUNTER — Other Ambulatory Visit: Payer: Self-pay

## 2020-05-15 ENCOUNTER — Ambulatory Visit (INDEPENDENT_AMBULATORY_CARE_PROVIDER_SITE_OTHER): Payer: 59 | Admitting: Physician Assistant

## 2020-05-15 ENCOUNTER — Encounter: Payer: Self-pay | Admitting: Physician Assistant

## 2020-05-15 VITALS — BP 116/82 | HR 77 | Temp 98.6°F | Ht 67.0 in | Wt 164.4 lb

## 2020-05-15 DIAGNOSIS — E559 Vitamin D deficiency, unspecified: Secondary | ICD-10-CM

## 2020-05-15 DIAGNOSIS — M546 Pain in thoracic spine: Secondary | ICD-10-CM

## 2020-05-15 DIAGNOSIS — M4683 Other specified inflammatory spondylopathies, cervicothoracic region: Secondary | ICD-10-CM

## 2020-05-15 NOTE — Patient Instructions (Signed)
It was great to see you! ? ?Please make an appointment with the lab on your way out. I would like for you to return for lab work within 1-2 weeks. After midnight on the day of the lab draw, please do not eat anything. You may have water, black coffee, unsweetened tea. ? ?Take care, ? ?Kathlee Barnhardt PA-C  ?

## 2020-05-15 NOTE — Progress Notes (Signed)
Antonio Santiago is a 45 y.o. male is here for follow up.  History of Present Illness:   Chief Complaint  Patient presents with  . Back Pain    HPI  Pt here to request labs to be done.  He is seeing Dr. Paulla Fore for his back pain and was requested to have labwork done. He has not had this done yet and was asking if we could do this. He also has vitamin D deficiency and wants his blood work updated. He currently doesn't take a supplement.   Health Maintenance Due  Topic Date Due  . Hepatitis C Screening  Never done  . COVID-19 Vaccine (1) Never done    No past medical history on file.   Social History   Tobacco Use  . Smoking status: Never Smoker  . Smokeless tobacco: Never Used  Substance Use Topics  . Alcohol use: Yes    Comment: very rare    No past surgical history on file.  Family History  Problem Relation Age of Onset  . Esophageal cancer Father   . Stomach cancer Father     PMHx, SurgHx, SocialHx, FamHx, Medications, and Allergies were reviewed in the Visit Navigator and updated as appropriate.   Patient Active Problem List   Diagnosis Date Noted  . Migraine 06/26/2019  . Chronic midline thoracic back pain 06/26/2019  . Overweight 06/26/2019    Social History   Tobacco Use  . Smoking status: Never Smoker  . Smokeless tobacco: Never Used  Substance Use Topics  . Alcohol use: Yes    Comment: very rare    Current Medications and Allergies:    Current Outpatient Medications:  .  rizatriptan (MAXALT) 10 MG tablet, Take 1 tablet (10 mg total) by mouth as needed for migraine. May repeat in 2 hours if needed, Disp: 10 tablet, Rfl: 2 .  celecoxib (CELEBREX) 100 MG capsule, Take 100 mg by mouth 2 (two) times daily. (Patient not taking: Reported on 05/15/2020), Disp: , Rfl:  .  diclofenac (VOLTAREN) 75 MG EC tablet, Take 1 tablet (75 mg total) by mouth 2 (two) times daily. (Patient not taking: No sig reported), Disp: 30 tablet, Rfl: 1  No Known  Allergies  Review of Systems   ROS Negative unless otherwise specified per HPI.  Vitals:   Vitals:   05/15/20 1500  BP: 116/82  Pulse: 77  Temp: 98.6 F (37 C)  TempSrc: Temporal  SpO2: 95%  Weight: 164 lb 6.4 oz (74.6 kg)  Height: _0  (1.702 m)     Body mass index is 25.75 kg/m.   Physical Exam:    Physical Exam Vitals and nursing note reviewed.  Constitutional:      Appearance: He is well-developed.  HENT:     Head: Normocephalic.  Eyes:     Conjunctiva/sclera: Conjunctivae normal.     Pupils: Pupils are equal, round, and reactive to light.  Pulmonary:     Effort: Pulmonary effort is normal.  Musculoskeletal:        General: Normal range of motion.     Cervical back: Normal range of motion.  Skin:    General: Skin is warm and dry.  Neurological:     Mental Status: He is alert and oriented to person, place, and time.  Psychiatric:        Behavior: Behavior normal.        Thought Content: Thought content normal.        Judgment: Judgment normal.  Assessment and Plan:    Antonio Santiago was seen today for back pain.  Diagnoses and all orders for this visit:  Thoracic spine pain; Neuropathic spondylopathy of cervicothoracic region [facility]);Vitamin D deficiency Blood work orders put in. He would like to come in while fasting in order to have his other blood work (fasting lipid panel) rechecked. Will fax results to Dr. Paulla Fore. -     Sedimentation rate; Future -     C-reactive protein; Future -     HLA-B27 Antigen; Future -     VITAMIN D 25 Hydroxy (Vit-D Deficiency, Fractures); Future  CMA or LPN served as scribe during this visit. History, Physical, and Plan performed by medical provider. The above documentation has been reviewed and is accurate and complete.  Inda Coke, PA-C [place], [facility] 05/15/2020  Follow-up: No follow-ups on file.

## 2020-05-21 ENCOUNTER — Other Ambulatory Visit: Payer: Self-pay

## 2020-05-21 ENCOUNTER — Encounter: Payer: Self-pay | Admitting: Physician Assistant

## 2020-05-21 ENCOUNTER — Other Ambulatory Visit (INDEPENDENT_AMBULATORY_CARE_PROVIDER_SITE_OTHER): Payer: 59

## 2020-05-21 DIAGNOSIS — R002 Palpitations: Secondary | ICD-10-CM

## 2020-05-21 DIAGNOSIS — E781 Pure hyperglyceridemia: Secondary | ICD-10-CM

## 2020-05-21 DIAGNOSIS — M546 Pain in thoracic spine: Secondary | ICD-10-CM | POA: Diagnosis not present

## 2020-05-21 DIAGNOSIS — M4683 Other specified inflammatory spondylopathies, cervicothoracic region: Secondary | ICD-10-CM

## 2020-05-21 DIAGNOSIS — E559 Vitamin D deficiency, unspecified: Secondary | ICD-10-CM

## 2020-05-21 LAB — VITAMIN D 25 HYDROXY (VIT D DEFICIENCY, FRACTURES): VITD: 21.07 ng/mL — ABNORMAL LOW (ref 30.00–100.00)

## 2020-05-21 LAB — LIPID PANEL
Cholesterol: 191 mg/dL (ref 0–200)
HDL: 48.1 mg/dL (ref >39.00)
LDL Cholesterol: 131 mg/dL — ABNORMAL HIGH (ref 0–99)
NonHDL: 143.27
Total CHOL/HDL Ratio: 4
Triglycerides: 62 mg/dL (ref 0.0–149.0)
VLDL: 12.4 mg/dL (ref 0.0–40.0)

## 2020-05-21 LAB — C-REACTIVE PROTEIN: CRP: <1 mg/dL (ref 0.5–20.0)

## 2020-05-21 LAB — SEDIMENTATION RATE: Sed Rate: 4 mm/hr (ref 0–15)

## 2020-05-21 NOTE — Addendum Note (Signed)
Addended by: Lurlean Horns on: 05/21/2020 08:27 AM   Modules accepted: Orders

## 2020-05-22 LAB — HLA-B27 ANTIGEN: HLA-B27 Antigen: POSITIVE — AB

## 2020-05-29 ENCOUNTER — Encounter: Payer: Self-pay | Admitting: Physician Assistant

## 2020-06-18 ENCOUNTER — Other Ambulatory Visit: Payer: Self-pay

## 2020-07-05 NOTE — Progress Notes (Deleted)
Cardiology Office Note:    Date:  07/05/2020   ID:  Antonio Santiago, DOB 08-30-1975, MRN [id number]  PCP:  Jarold Motto, PA   [facility] Providers Cardiologist:  None {  Referring MD: Jarold Motto, PA     History of Present Illness:    Antonio Santiago is a 45 y.o. male with a hx of chronic back pain and migraines who was referred by Jarold Motto, PA for further evaluation of palpitations.  No past medical history on file.  No past surgical history on file.  Current Medications: No outpatient medications have been marked as taking for the 07/08/20 encounter (Appointment) with Meriam Sprague, MD.     Allergies:   Patient has no known allergies.   Social History   Socioeconomic History  . Marital status: Married    Spouse name: Not on file  . Number of children: Not on file  . Years of education: Not on file  . Highest education level: Not on file  Occupational History  . Not on file  Tobacco Use  . Smoking status: Never Smoker  . Smokeless tobacco: Never Used  Substance and Sexual Activity  . Alcohol use: Yes    Comment: very rare  . Drug use: Not on file  . Sexual activity: Not on file  Other Topics Concern  . Not on file  Social History Narrative  . Not on file   Social Determinants of Health   Financial Resource Strain: Not on file  Food Insecurity: Not on file  Transportation Needs: Not on file  Physical Activity: Not on file  Stress: Not on file  Social Connections: Not on file     Family History: The patient's ***family history includes Esophageal cancer in his father; Stomach cancer in his father.  ROS:   Please see the history of present illness.    *** All other systems reviewed and are negative.  EKGs/Labs/Other Studies Reviewed:    The following studies were reviewed today: ***  EKG:  EKG is *** ordered today.  The ekg ordered today demonstrates ***  Recent Labs: 04/20/2020: ALT 15; BUN 13; Creatinine, Ser 0.92;  Hemoglobin 15.5; Platelets 238.0; Potassium 4.0; Sodium 141; TSH 0.77  Recent Lipid Panel    Component Value Date/Time   CHOL 191 05/21/2020 0827   TRIG 62.0 05/21/2020 0827   HDL 48.10 05/21/2020 0827   CHOLHDL 4 05/21/2020 0827   VLDL 12.4 05/21/2020 0827   LDLCALC 131 (H) 05/21/2020 0827   LDLDIRECT 109.0 04/20/2020 1546     Risk Assessment/Calculations:   {Does this patient have ATRIAL FIBRILLATION?:[phone]}   Physical Exam:    VS:  There were no vitals taken for this visit.    Wt Readings from Last 3 Encounters:  05/15/20 164 lb 6.4 oz (74.6 kg)  04/20/20 165 lb 6.1 oz (75 kg)  01/21/20 171 lb 6.4 oz (77.7 kg)     GEN: *** Well nourished, well developed in no acute distress HEENT: Normal NECK: No JVD; No carotid bruits LYMPHATICS: No lymphadenopathy CARDIAC: ***RRR, no murmurs, rubs, gallops RESPIRATORY:  Clear to auscultation without rales, wheezing or rhonchi  ABDOMEN: Soft, non-tender, non-distended MUSCULOSKELETAL:  No edema; No deformity  SKIN: Warm and dry NEUROLOGIC:  Alert and oriented x 3 PSYCHIATRIC:  Normal affect   ASSESSMENT:    No diagnosis found. PLAN:    In order of problems listed above:  #Palpitations: -Check 7 day zio -Increase hydration -Avoid caffeine   {Are you ordering a CV  Procedure (e.g. stress test, cath, DCCV, TEE, etc)?   Press F2        :[id number]}    Medication Adjustments/Labs and Tests Ordered: Current medicines are reviewed at length with the patient today.  Concerns regarding medicines are outlined above.  No orders of the defined types were placed in this encounter.  No orders of the defined types were placed in this encounter.   There are no Patient Instructions on file for this visit.   Signed, Meriam Sprague, MD  07/05/2020 8:37 PM    [facility]

## 2020-07-08 ENCOUNTER — Ambulatory Visit (INDEPENDENT_AMBULATORY_CARE_PROVIDER_SITE_OTHER): Payer: 59

## 2020-07-08 ENCOUNTER — Other Ambulatory Visit: Payer: Self-pay

## 2020-07-08 ENCOUNTER — Ambulatory Visit: Payer: 59 | Admitting: Cardiology

## 2020-07-08 ENCOUNTER — Encounter: Payer: Self-pay | Admitting: Cardiology

## 2020-07-08 VITALS — BP 116/72 | HR 68 | Ht 67.0 in | Wt 165.4 lb

## 2020-07-08 DIAGNOSIS — R002 Palpitations: Secondary | ICD-10-CM

## 2020-07-08 NOTE — Progress Notes (Signed)
Cardiology Office Note:    Date:  07/08/2020   ID:  Antonio Santiago, DOB 19-May-1975, MRN [id number]  PCP:  Jarold Motto, PA   [facility] Providers Cardiologist:  None {  Referring MD: Jarold Motto, PA     History of Present Illness:    Antonio Santiago is a 45 y.o. male with a hx of chronic back pain and migraines who was referred by Jarold Motto, PA for further evaluation of palpitations.  The patient states that he has been having palpitations over the past 3-4 months. Each episode lasts for 1-2 minutes. He has had 3-4 episodes prior to this visit. He has some central chest discomfort when the episode occurs, and states it sometimes hard to catch his breath. He drinks a lot of caffeine each day (3-4 coffees), but he does not have palpitations after drinking caffeine. He drinks water occasionally, but admits he does not stay well hydrated. He denies having any exertional chest tightness, pressure, or pain. He has no  PND, orthopnea, LE edema, lightedness, pre-syncopal, or syncopal episodes. He denies having thyroid issues.   No FHx of CAD, and he has never been diagnosed with CAD in the past.   History reviewed. No pertinent past medical history.  History reviewed. No pertinent surgical history.  Current Medications: Current Meds  Medication Sig  . D3-50 1.25 MG (50000 UT) capsule Take 50,000 Units by mouth once a week.  . rizatriptan (MAXALT) 10 MG tablet Take 1 tablet (10 mg total) by mouth as needed for migraine. May repeat in 2 hours if needed     Allergies:   Patient has no known allergies.   Social History   Socioeconomic History  . Marital status: Married    Spouse name: Not on file  . Number of children: Not on file  . Years of education: Not on file  . Highest education level: Not on file  Occupational History  . Not on file  Tobacco Use  . Smoking status: Never Smoker  . Smokeless tobacco: Never Used  Substance and Sexual Activity  . Alcohol use:  Yes    Comment: very rare  . Drug use: Not on file  . Sexual activity: Not on file  Other Topics Concern  . Not on file  Social History Narrative  . Not on file   Social Determinants of Health   Financial Resource Strain: Not on file  Food Insecurity: Not on file  Transportation Needs: Not on file  Physical Activity: Not on file  Stress: Not on file  Social Connections: Not on file     Family History: The patient's family history includes Esophageal cancer in his father; Stomach cancer in his father.  ROS:   Review of Systems  Constitutional: Negative for chills, fever and malaise/fatigue.  HENT: Negative for ear pain.   Respiratory: Negative for cough.   Cardiovascular: Positive for chest pain and palpitations. Negative for orthopnea, leg swelling and PND.  Gastrointestinal: Negative for abdominal pain, Santiago in stool, diarrhea, heartburn, nausea and vomiting.  Genitourinary: Negative for dysuria and hematuria.  Musculoskeletal: Negative for back pain.  Neurological: Negative for dizziness.     EKGs/Labs/Other Studies Reviewed:    EKG:   07/08/20- normal sinus rhythm, rate 68   Recent Labs: 04/20/2020: ALT 15; BUN 13; Creatinine, Ser 0.92; Hemoglobin 15.5; Platelets 238.0; Potassium 4.0; Sodium 141; TSH 0.77  Recent Lipid Panel    Component Value Date/Time   CHOL 191 05/21/2020 0827   TRIG 62.0 05/21/2020  0827   HDL 48.10 05/21/2020 0827   CHOLHDL 4 05/21/2020 0827   VLDL 12.4 05/21/2020 0827   LDLCALC 131 (H) 05/21/2020 0827   LDLDIRECT 109.0 04/20/2020 1546     Physical Exam:    VS:  BP 116/72   Pulse 68   Ht 5\' 7"  (1.702 m)   Wt 165 lb 6.4 oz (75 kg)   SpO2 98%   BMI 25.91 kg/m     Wt Readings from Last 3 Encounters:  07/08/20 165 lb 6.4 oz (75 kg)  05/15/20 164 lb 6.4 oz (74.6 kg)  04/20/20 165 lb 6.1 oz (75 kg)     GEN: Well nourished, well developed in no acute distress HEENT: Normal NECK: No JVD; No carotid bruits CARDIAC: RRR, no  murmurs, rubs, gallops RESPIRATORY:  Clear to auscultation without rales, wheezing or rhonchi  ABDOMEN: Soft, non-tender, non-distended MUSCULOSKELETAL:  No edema; No deformity  SKIN: Warm and dry NEUROLOGIC:  Alert and oriented x 3 PSYCHIATRIC:  Normal affect   ASSESSMENT:    1. Palpitations    PLAN:    In order of problems listed above:  #Palpitations: Patient with 4 month history of intermittent palpitations that last for a couple of minutes and then resolve. Has had 4 total episodes. Has some associated SOB and chest discomfort. No lightheadedness, syncope, dizziness. No anginal or HF symptoms. Symptoms are not exertional and he cannot predict when they are going to occur.The patient is otherwise healthy with no personal or family history of CAD.  Notably, has been drinking significant coffee and has nto been well hydrated which may be contributing. TSH normal. -Check 14 day zio -Increase hydration -Decrease caffeine intake -TSH normal; no significant alcohol use; no tobacc use   Medication Adjustments/Labs and Tests Ordered: Current medicines are reviewed at length with the patient today.  Concerns regarding medicines are outlined above.  Orders Placed This Encounter  Procedures  . LONG TERM MONITOR (3-14 DAYS)  . EKG 12-Lead   No orders of the defined types were placed in this encounter.   Patient Instructions  Medication Instructions:   Your physician recommends that you continue on your current medications as directed. Please refer to the Current Medication list given to you today.  *If you need a refill on your cardiac medications before your next appointment, please call your pharmacy*   Testing/Procedures:  ZIO XT- Long Term Monitor Instructions   Your physician has requested you wear a ZIO patch monitor for _14__ days.  This is a single patch monitor.   [employer] supplies one patch monitor per enrollment. Additional stickers are not available. Please do not  apply patch if you will be having a Nuclear Stress Test, Echocardiogram, Cardiac CT, MRI, or Chest Xray during the period you would be wearing the monitor. The patch cannot be worn during these tests. You cannot remove and re-apply the ZIO XT patch monitor.  Your ZIO patch monitor will be sent Fed Ex from 04/22/20 directly to your home address. It may take 3-5 days to receive your monitor after you have been enrolled.  Once you have received your monitor, please review the enclosed instructions. Your monitor has already been registered assigning a specific monitor serial # to you.  Billing and Patient Assistance Program Information   We have supplied [employer] with any of your insurance information on file for billing purposes. [employer] offers a sliding scale Patient Assistance Program for patients that do not have insurance, or whose insurance does not  completely cover the cost of the ZIO monitor.   You must apply for the Patient Assistance Program to qualify for this discounted rate.     To apply, please call [employer] at [phone], select option 4, then select option 2, and ask to apply for Patient Assistance Program.  Meredeth Ide will ask your household income, and how many people are in your household.  They will quote your out-of-pocket cost based on that information.  [employer] will also be able to set up a 82-month, interest-free payment plan if needed.  Applying the monitor   Shave hair from upper left chest.  Hold abrader disc by orange tab. Rub abrader in 40 strokes over the upper left chest as indicated in your monitor instructions.  Clean area with 4 enclosed alcohol pads. Let dry.  Apply patch as indicated in monitor instructions. Patch will be placed under collarbone on left side of chest with arrow pointing upward.  Rub patch adhesive wings for 2 minutes. Remove white label marked "1". Remove the white label marked "2". Rub patch adhesive wings for 2 additional minutes.  While  looking in a mirror, press and release button in center of patch. A small green light will flash 3-4 times. This will be your only indicator that the monitor has been turned on. ?  Do not shower for the first 24 hours. You may shower after the first 24 hours.  Press the button if you feel a symptom. You will hear a small click. Record Date, Time and Symptom in the Patient Logbook.  When you are ready to remove the patch, follow instructions on the last 2 pages of the Patient Logbook. Stick patch monitor onto the last page of Patient Logbook.  Place Patient Logbook in the blue and white box.  Use locking tab on box and tape box closed securely.  The blue and white box has prepaid postage on it. Please place it in the mailbox as soon as possible. Your physician should have your test results approximately 7 days after the monitor has been mailed back to [facility].  Call [facility] Customer Care at [phone] if you have questions regarding your ZIO XT patch monitor. Call them immediately if you see an orange light blinking on your monitor.  If your monitor falls off in less than 4 days, contact our Monitor department at [phone]. ?If your monitor becomes loose or falls off after 4 days call [employer] at [phone] for suggestions on securing your monitor.?   Follow-Up:  AS NEEDED WITH DR. Shari Prows        I,Alexis Bryant,acting as a scribe for Meriam Sprague, MD.,have documented all relevant documentation on the behalf of Meriam Sprague, MD,as directed by  Meriam Sprague, MD while in the presence of Meriam Sprague, MD.  I, Meriam Sprague, MD, have reviewed all documentation for this visit. The documentation on 07/08/20 for the exam, diagnosis, procedures, and orders are all accurate and complete. Signed, Meriam Sprague, MD  07/08/2020 3:05 PM    [facility]

## 2020-07-08 NOTE — Progress Notes (Unsigned)
Patient enrolled for [employer] to mail a 14 day ZIO XT to his home.

## 2020-07-08 NOTE — Patient Instructions (Signed)
Medication Instructions:   Your physician recommends that you continue on your current medications as directed. Please refer to the Current Medication list given to you today.  *If you need a refill on your cardiac medications before your next appointment, please call your pharmacy*   Testing/Procedures:  ZIO XT- Long Term Monitor Instructions   Your physician has requested you wear a ZIO patch monitor for _14__ days.  This is a single patch monitor.   [employer] supplies one patch monitor per enrollment. Additional stickers are not available. Please do not apply patch if you will be having a Nuclear Stress Test, Echocardiogram, Cardiac CT, MRI, or Chest Xray during the period you would be wearing the monitor. The patch cannot be worn during these tests. You cannot remove and re-apply the ZIO XT patch monitor.  Your ZIO patch monitor will be sent Fed Ex from [employer] directly to your home address. It may take 3-5 days to receive your monitor after you have been enrolled.  Once you have received your monitor, please review the enclosed instructions. Your monitor has already been registered assigning a specific monitor serial # to you.  Billing and Patient Assistance Program Information   We have supplied [employer] with any of your insurance information on file for billing purposes. [employer] offers a sliding scale Patient Assistance Program for patients that do not have insurance, or whose insurance does not completely cover the cost of the ZIO monitor.   You must apply for the Patient Assistance Program to qualify for this discounted rate.     To apply, please call [employer] at [phone], select option 4, then select option 2, and ask to apply for Patient Assistance Program.  [employer] will ask your household income, and how many people are in your household.  They will quote your out-of-pocket cost based on that information.  [employer] will also be able to set up a 12-month, interest-free  payment plan if needed.  Applying the monitor   Shave hair from upper left chest.  Hold abrader disc by orange tab. Rub abrader in 40 strokes over the upper left chest as indicated in your monitor instructions.  Clean area with 4 enclosed alcohol pads. Let dry.  Apply patch as indicated in monitor instructions. Patch will be placed under collarbone on left side of chest with arrow pointing upward.  Rub patch adhesive wings for 2 minutes. Remove white label marked "1". Remove the white label marked "2". Rub patch adhesive wings for 2 additional minutes.  While looking in a mirror, press and release button in center of patch. A small green light will flash 3-4 times. This will be your only indicator that the monitor has been turned on. ?  Do not shower for the first 24 hours. You may shower after the first 24 hours.  Press the button if you feel a symptom. You will hear a small click. Record Date, Time and Symptom in the Patient Logbook.  When you are ready to remove the patch, follow instructions on the last 2 pages of the Patient Logbook. Stick patch monitor onto the last page of Patient Logbook.  Place Patient Logbook in the blue and white box.  Use locking tab on box and tape box closed securely.  The blue and white box has prepaid postage on it. Please place it in the mailbox as soon as possible. Your physician should have your test results approximately 7 days after the monitor has been mailed back to [employer].  Call [employer] Customer Care at [phone] if you have questions regarding your ZIO XT patch monitor. Call them immediately if you see an orange light blinking on your monitor.  If your monitor falls off in less than 4 days, contact our Monitor department at [phone]. ?If your monitor becomes loose or falls off after 4 days call [employer] at [phone] for suggestions on securing your monitor.?    Follow-Up:  AS NEEDED WITH DR. PEMBERTON   

## 2020-07-19 DIAGNOSIS — R002 Palpitations: Secondary | ICD-10-CM | POA: Diagnosis not present

## 2020-07-30 ENCOUNTER — Other Ambulatory Visit: Payer: Self-pay

## 2020-07-30 ENCOUNTER — Ambulatory Visit (INDEPENDENT_AMBULATORY_CARE_PROVIDER_SITE_OTHER): Payer: 59 | Admitting: Registered Nurse

## 2020-07-30 ENCOUNTER — Encounter: Payer: Self-pay | Admitting: Registered Nurse

## 2020-07-30 ENCOUNTER — Telehealth: Payer: Self-pay

## 2020-07-30 VITALS — BP 110/71 | HR 68 | Temp 98.4°F | Resp 18 | Ht 67.0 in | Wt 168.8 lb

## 2020-07-30 DIAGNOSIS — G8929 Other chronic pain: Secondary | ICD-10-CM

## 2020-07-30 DIAGNOSIS — M546 Pain in thoracic spine: Secondary | ICD-10-CM | POA: Diagnosis not present

## 2020-07-30 DIAGNOSIS — M5441 Lumbago with sciatica, right side: Secondary | ICD-10-CM

## 2020-07-30 MED ORDER — METHYLPREDNISOLONE ACETATE 80 MG/ML IJ SUSP
80.0000 mg | Freq: Once | INTRAMUSCULAR | Status: AC
  Administered 2020-07-30: 80 mg via INTRAMUSCULAR

## 2020-07-30 MED ORDER — METHOCARBAMOL 500 MG PO TABS: 500.0000 mg | ORAL_TABLET | Freq: Three times a day (TID) | ORAL | 0 refills | Status: DC | PRN

## 2020-07-30 MED ORDER — DICLOFENAC SODIUM 75 MG PO TBEC: 75.0000 mg | DELAYED_RELEASE_TABLET | Freq: Two times a day (BID) | ORAL | 0 refills | Status: DC | PRN

## 2020-07-30 NOTE — Patient Instructions (Addendum)
Mr. Antonio Santiago to meet you. I hope you enjoy your travel to [place].   We have done a Depo Medrol injection today. This works as an anti-inflammatory to help relieve pressure on the nerves in your lower back and legs. This should help with pain quite a bit.  I have sent diclofenac to your pharmacy. This is an anti-inflammatory and pain reliever. Please take this once or twice each day as needed for back pain. It may upset your stomach if taken on an empty stomach. Please take this with water and food.  I have sent methocarbamol to your pharmacy. This is a muscle relaxer. Please take once, twice, or three times each day as needed for back pain. It may make you a little sleepy - use this with caution until you know how you react.  Continue to stretch and exercise.   Medications from [name] -   Vitamin D supplement 50,000 units weekly: She was recommending this because your vitamin D levels are pretty low - last check was at 20 when we would like them to be between 30-100.   Rizatriptan (Maxalt): this medication is for use as needed for migraines. Taking one tablet as soon as you feel a migraine can help reduce symptoms and shorten the headache.   All of these medications are okay to take together. They should not interact.  Let me know if your pain gets worse or doesn't get better before you leave. Safe travels!  Rich

## 2020-07-30 NOTE — Telephone Encounter (Signed)
Noted  

## 2020-07-30 NOTE — Progress Notes (Signed)
Acute Office Visit  Subjective:    Patient ID: Antonio Santiago, male    DOB: 1976/02/12, 45 y.o.   MRN: [id number]  Chief Complaint  Patient presents with   Back Pain    Patient states he is here because he has been having some back pain for about 2 months. Patient states he has only took ibuprofen and aleve with no .     HPI Patient is in today for lower back pain  Ongoing issue with flares 3-4 times each year Lower right back with R side sciatica. Some thoracic spine tenderness in range of T9-L1. Mostly paraspinal R side tenderness.  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 Some results of MRI from late 2021 suggesting he may be moving towards AS, but nothing definitive beyond L spine endplate changes and T spine foraminal disc protrusions and minimal disc bulging.    Has been working with Dr. Paulla Fore on nonpharm relief - not currently taking any medication except OTC analgesics. Notes that he has taken diclofenac in the past with good effect, no Aes. PDMP showing empty   History reviewed. No pertinent past medical history.  History reviewed. No pertinent surgical history.  Family History  Problem Relation Age of Onset   Esophageal cancer Father    Stomach cancer Father     Social History   Socioeconomic History   Marital status: Married    Spouse name: Not on file   Number of children: Not on file   Years of education: Not on file   Highest education level: Not on file  Occupational History   Not on file  Tobacco Use   Smoking status: Never   Smokeless tobacco: Never  Substance and Sexual Activity   Alcohol use: Yes    Comment: very rare   Drug use: Not on file   Sexual activity: Not on file  Other Topics Concern   Not on file  Social History Narrative   Not on file   Social Determinants of Health   Financial Resource Strain: Not on file  Food Insecurity: Not on file  Transportation Needs: Not  on file  Physical Activity: Not on file  Stress: Not on file  Social Connections: Not on file  Intimate Partner Violence: Not on file    Outpatient Medications Prior to Visit  Medication Sig Dispense Refill   D3-50 1.25 MG (50000 UT) capsule Take 50,000 Units by mouth once a week.     rizatriptan (MAXALT) 10 MG tablet Take 1 tablet (10 mg total) by mouth as needed for migraine. May repeat in 2 hours if needed 10 tablet 2   No facility-administered medications prior to visit.    No Known Allergies  Review of Systems  Constitutional: Negative.   HENT: Negative.    Eyes: Negative.   Respiratory: Negative.    Cardiovascular: Negative.   Gastrointestinal: Negative.   Genitourinary: Negative.   Musculoskeletal:  Positive for back pain.  Skin: Negative.   Neurological: Negative.   Psychiatric/Behavioral: Negative.        Objective:    Physical Exam Vitals and nursing note reviewed.  Constitutional:      Appearance: Normal appearance.  Cardiovascular:     Rate and Rhythm: Normal rate and regular rhythm.     Pulses: Normal pulses.     Heart sounds: Normal heart sounds. No murmur heard.   No friction rub. No gallop.  Pulmonary:  Effort: Pulmonary effort is normal. No respiratory distress.     Breath sounds: Normal breath sounds. No stridor. No wheezing, rhonchi or rales.  Musculoskeletal:       Back:     Comments: Positive straight leg raise R side. Negative straight leg raise L side  Neurological:     General: No focal deficit present.     Mental Status: He is alert. Mental status is at baseline.  Psychiatric:        Mood and Affect: Mood normal.        Behavior: Behavior normal.        Thought Content: Thought content normal.        Judgment: Judgment normal.    BP 110/71   Pulse 68   Temp 98.4 F (36.9 C) (Temporal)   Resp 18   Ht 5' 7"  (1.702 m)   Wt 168 lb 12.8 oz (76.6 kg)   SpO2 97%   BMI 26.44 kg/m  Wt Readings from Last 3 Encounters:  07/30/20  168 lb 12.8 oz (76.6 kg)  07/08/20 165 lb 6.4 oz (75 kg)  05/15/20 164 lb 6.4 oz (74.6 kg)    There are no preventive care reminders to display for this patient.  There are no preventive care reminders to display for this patient.   Lab Results  Component Value Date   TSH 0.77 04/20/2020   Lab Results  Component Value Date   WBC 7.8 04/20/2020   HGB 15.5 04/20/2020   HCT 43.7 04/20/2020   MCV 90.2 04/20/2020   PLT 238.0 04/20/2020   Lab Results  Component Value Date   NA 141 04/20/2020   K 4.0 04/20/2020   CO2 31 04/20/2020   GLUCOSE 87 04/20/2020   BUN 13 04/20/2020   CREATININE 0.92 04/20/2020   BILITOT 0.4 04/20/2020   ALKPHOS 61 04/20/2020   AST 16 04/20/2020   ALT 15 04/20/2020   PROT 6.9 04/20/2020   ALBUMIN 4.4 04/20/2020   CALCIUM 9.2 04/20/2020   GFR 101.71 04/20/2020   Lab Results  Component Value Date   CHOL 191 05/21/2020   Lab Results  Component Value Date   HDL 48.10 05/21/2020   Lab Results  Component Value Date   LDLCALC 131 (H) 05/21/2020   Lab Results  Component Value Date   TRIG 62.0 05/21/2020   Lab Results  Component Value Date   CHOLHDL 4 05/21/2020   No results found for: HGBA1C     Assessment & Plan:   Problem List Items Addressed This Visit       Other   Chronic midline thoracic back pain   Relevant Medications   diclofenac (VOLTAREN) 75 MG EC tablet   methocarbamol (ROBAXIN) 500 MG tablet   Other Visit Diagnoses     Acute right-sided low back pain with right-sided sciatica    -  Primary   Relevant Medications   methylPREDNISolone acetate (DEPO-MEDROL) injection 80 mg (Completed)   diclofenac (VOLTAREN) 75 MG EC tablet   methocarbamol (ROBAXIN) 500 MG tablet        Meds ordered this encounter  Medications   methylPREDNISolone acetate (DEPO-MEDROL) injection 80 mg   diclofenac (VOLTAREN) 75 MG EC tablet    Sig: Take 1 tablet (75 mg total) by mouth 2 (two) times daily as needed for moderate pain.     Dispense:  90 tablet    Refill:  0    Order Specific Question:   Supervising Provider    Answer:  GREENE, JEFFREY R [[id number]]   methocarbamol (ROBAXIN) 500 MG tablet    Sig: Take 1 tablet (500 mg total) by mouth 3 (three) times daily as needed for muscle spasms.    Dispense:  90 tablet    Refill:  0    Order Specific Question:   Supervising Provider    Answer:   Carlota Raspberry, JEFFREY R [[id number]]   PLAN Depo medrol injection given in office Sent diclofenac and methocarbamol to pharmacy. Reviewed r/b/se of these medications Continue nonpharm work with trainer Return if worsening or failing to improve Discussed benefits of neursurgical consult, pt will contact office if he desire this Patient encouraged to call clinic with any questions, comments, or concerns.  Maximiano Coss, NP

## 2020-07-30 NOTE — Telephone Encounter (Signed)
Patient is scheduled for [employer].   Nurse Assessment Nurse: Renato Gails, RN, Cristan Date/Time (Eastern Time): 07/29/2020 5:33:09 PM Confirm and document reason for call. If symptomatic, describe symptoms. ---Caller says her husband needs his script Prednisone 20mg  for his back. He has back pain. He needs to start this tonight and it is not at the pharmacy. Uses [facility] pharmacy [phone]. back pain started 2 days. Pain is in lower back and sciatic. Does the patient have any new or worsening symptoms? ---Yes Will a triage be completed? ---Yes Related visit to physician within the last 2 weeks? ---No Does the PT have any chronic conditions? (i.e. diabetes, asthma, this includes High risk factors for pregnancy, etc.) ---No Is this a behavioral health or substance abuse call? ---No Guidelines Guideline Title Affirmed Question Affirmed Notes Nurse Date/Time (Eastern Time) Back Pain Weakness of a leg or foot (e.g., unable to bear weight, dragging foot) Reed, RN, Cristan 07/29/2020 5:38:08 PM PLEASE NOTE: All timestamps contained within this report are represented as 09/28/2020 Standard Time. CONFIDENTIALTY NOTICE: This fax transmission is intended only for the addressee. It contains information that is legally privileged, confidential or otherwise protected from use or disclosure. If you are not the intended recipient, you are strictly prohibited from reviewing, disclosing, copying using or disseminating any of this information or taking any action in reliance on or regarding this information. If you have received this fax in error, please notify [place] immediately by telephone so that we can arrange for its return to [place]. Phone: [phone], Toll-Free: [phone], Fax: [phone] Page: 2 of 2 Call Id: [phone] Disp. Time [id number] Time) Disposition Final User 07/29/2020 5:45:03 PM Go to ED Now (or PCP triage) Yes 09/28/2020, RN, Cristan Caller Disagree/Comply Disagree Caller Understands  Yes PreDisposition Did not know what to do Care Advice Given Per Guideline GO TO ED NOW (OR PCP TRIAGE): * IF NO PCP (PRIMARY CARE PROVIDER) SECOND-LEVEL TRIAGE: You need to be seen within the next hour. Go to the ED/UCC at _____________ Hospital. Leave as soon as you can. ANOTHER ADULT SHOULD DRIVE: * It is better and safer if another adult drives instead of you. CARE ADVICE given per Back Pain (Adult) guideline. Comments User: Renato Gails, RN Date/Time (Eastern Time): 07/29/2020 5:47:10 PM caller adamantly declined recommended advice to go to Ed now for complaints of severe back pain and leg weakness, reports "i know what's wrong i don't need to be seen i need the medication." Caller instructed that this nurse would not be able to call in medication as it is not a maintenance medication. User: 09/28/2020, RN Date/Time (Eastern Time): 07/29/2020 5:47:32 PM caller reports he will be seen tomorrow. Referrals GO TO FACILITY REFUSE

## 2020-11-04 ENCOUNTER — Telehealth: Payer: Self-pay

## 2020-11-04 NOTE — Telephone Encounter (Signed)
[facility] - Client TELEPHONE ADVICE RECORD [employer] Patient Name: Antonio Santiago Gender: Male DOB: 1975-06-17 Age: 45 Y 3 M 11 D Return Phone Number: [phone] (Primary) Address: City/ State/ Zip: [place]  [address] Client  [facility] - [facility], [facility] Physician Bufford Buttner, [place]- [place] Contact Type Call Who Is Calling Patient / Member / Family / Caregiver Call Type Triage / Clinical Relationship To Patient Self Return Phone Number [phone] (Primary) Chief Complaint Abdominal Pain Reason for Call Symptomatic / Request for Health Information Initial Comment Caller states he is having pain from back left side to front of the abdomen. Translation No Nurse Assessment Nurse: Violeta Gelinas, RN, Regulatory affairs officer (Eastern Time): 11/03/2020 4:50:15 PM Confirm and document reason for call. If symptomatic, describe symptoms. ---Pt having left sided back pain and left abdominal pain. no fever, no vomiting/diarrhea. back pain is worse than abd pain. pain of back is 9 of 10. symptom onset 5 days. Does the patient have any new or worsening symptoms? ---Yes Will a triage be completed? ---Yes Related visit to physician within the last 2 weeks? ---No Does the PT have any chronic conditions? (i.e. diabetes, asthma, this includes High risk factors for pregnancy, etc.) ---No Is this a behavioral health or substance abuse call? ---No Guidelines Guideline Title Affirmed Question Affirmed Notes Nurse Date/Time (Eastern Time) Back Pain Weakness of a leg or foot (e.g., unable to bear weight, dragging foot) Violeta Gelinas, RN, [facility] 11/03/2020 4:53:48 PM Disp. Time Lamount Cohen Time) Disposition Final User 11/03/2020 4:37:52 PM Attempt made - message left Violeta Gelinas, RN, [facility] PLEASE NOTE: All timestamps contained within this report are represented as [place] Standard  Time. CONFIDENTIALTY NOTICE: This fax transmission is intended only for the addressee. It contains information that is legally privileged, confidential or otherwise protected from use or disclosure. If you are not the intended recipient, you are strictly prohibited from reviewing, disclosing, copying using or disseminating any of this information or taking any action in reliance on or regarding this information. If you have received this fax in error, please notify us immediately by telephone so that we can arrange for its return to [place]. Phone: [phone], Toll-Free: [phone], Fax: [phone] Page: 2 of 2 Call Id: [id number] 11/03/2020 4:57:44 PM Go to ED Now (or PCP triage) Yes Violeta Gelinas, RN, Amber Caller Disagree/Comply Disagree Caller Understands Yes PreDisposition Did not know what to do Care Advice Given Per Guideline GO TO ED NOW (OR PCP TRIAGE): CARE ADVICE given per Back Pain (Adult) guideline. Comments User: Adriana Simas, RN Date/Time Lamount Cohen Time): 11/03/2020 4:58:20 PM Pt states has appointment on thursday. Referrals GO TO FACILITY REFUSE

## 2020-11-04 NOTE — Telephone Encounter (Signed)
Please see message and advise. Pt scheduled for tomorrow.

## 2020-11-05 ENCOUNTER — Ambulatory Visit (INDEPENDENT_AMBULATORY_CARE_PROVIDER_SITE_OTHER): Payer: 59 | Admitting: Physician Assistant

## 2020-11-05 ENCOUNTER — Other Ambulatory Visit: Payer: Self-pay

## 2020-11-05 ENCOUNTER — Ambulatory Visit (HOSPITAL_COMMUNITY)
Admission: RE | Admit: 2020-11-05 | Discharge: 2020-11-05 | Disposition: A | Discharge status: Home / Self Care | Payer: 59 | Source: Ambulatory Visit | Attending: Physician Assistant | Admitting: Physician Assistant

## 2020-11-05 ENCOUNTER — Encounter: Payer: Self-pay | Admitting: Physician Assistant

## 2020-11-05 VITALS — BP 110/78 | HR 76 | Temp 98.4°F | Ht 67.0 in | Wt 166.5 lb

## 2020-11-05 DIAGNOSIS — R1030 Lower abdominal pain, unspecified: Secondary | ICD-10-CM

## 2020-11-05 DIAGNOSIS — R109 Unspecified abdominal pain: Secondary | ICD-10-CM | POA: Diagnosis not present

## 2020-11-05 LAB — CBC WITH DIFFERENTIAL/PLATELET
Basophils Absolute: 0 10*3/uL (ref 0.0–0.1)
Basophils Relative: 0.6 % (ref 0.0–3.0)
Eosinophils Absolute: 0.1 10*3/uL (ref 0.0–0.7)
Eosinophils Relative: 0.9 % (ref 0.0–5.0)
HCT: 44.7 % (ref 39.0–52.0)
Hemoglobin: 15 g/dL (ref 13.0–17.0)
Lymphocytes Relative: 30.8 % (ref 12.0–46.0)
Lymphs Abs: 1.7 10*3/uL (ref 0.7–4.0)
MCHC: 33.6 g/dL (ref 30.0–36.0)
MCV: 90.5 fl (ref 78.0–100.0)
Monocytes Absolute: 0.4 10*3/uL (ref 0.1–1.0)
Monocytes Relative: 7.6 % (ref 3.0–12.0)
Neutro Abs: 3.3 10*3/uL (ref 1.4–7.7)
Neutrophils Relative %: 60.1 % (ref 43.0–77.0)
Platelets: 223 10*3/uL (ref 150.0–400.0)
RBC: 4.94 Mil/uL (ref 4.22–5.81)
RDW: 13.3 % (ref 11.5–15.5)
WBC: 5.4 10*3/uL (ref 4.0–10.5)

## 2020-11-05 LAB — COMPREHENSIVE METABOLIC PANEL
ALT: 17 U/L (ref 0–53)
AST: 17 U/L (ref 0–37)
Albumin: 4.4 g/dL (ref 3.5–5.2)
Alkaline Phosphatase: 53 U/L (ref 39–117)
BUN: 15 mg/dL (ref 6–23)
CO2: 29 mEq/L (ref 19–32)
Calcium: 9.2 mg/dL (ref 8.4–10.5)
Chloride: 104 mEq/L (ref 96–112)
Creatinine, Ser: 0.79 mg/dL (ref 0.40–1.50)
GFR: 108.21 mL/min (ref >60.00)
Glucose, Bld: 81 mg/dL (ref 70–99)
Potassium: 3.8 mEq/L (ref 3.5–5.1)
Sodium: 140 mEq/L (ref 135–145)
Total Bilirubin: 0.7 mg/dL (ref 0.2–1.2)
Total Protein: 7.1 g/dL (ref 6.0–8.3)

## 2020-11-05 LAB — POC URINALSYSI DIPSTICK (AUTOMATED)
Bilirubin, UA: NEGATIVE
Blood, UA: NEGATIVE
Glucose, UA: NEGATIVE
Ketones, UA: NEGATIVE
Leukocytes, UA: NEGATIVE
Nitrite, UA: NEGATIVE
Protein, UA: NEGATIVE
Spec Grav, UA: 1.02 (ref 1.010–1.025)
Urobilinogen, UA: 0.2 E.U./dL
pH, UA: 6 (ref 5.0–8.0)

## 2020-11-05 MED ORDER — KETOROLAC TROMETHAMINE 60 MG/2ML IM SOLN
60.0000 mg | Freq: Once | INTRAMUSCULAR | Status: AC
  Administered 2020-11-05: 60 mg via INTRAMUSCULAR

## 2020-11-05 NOTE — Patient Instructions (Signed)
It was great to see you!  Toradol injection today for pain  Urine study was normal  Bloodwork is pending.  We are ordering a stat CT to help determine what's going on. I will be in touch after this.  Abdominal Pain, Adult Many things can cause belly (abdominal) pain. Most times, belly pain is not dangerous. Many cases of belly pain can be watched and treated at home. Sometimes, though, belly pain is serious. Your doctor will try to find the cause of your belly pain. Follow these instructions at home: Medicines Take over-the-counter and prescription medicines only as told by your doctor. Do not take medicines that help you poop (laxatives) unless told by your doctor. General instructions Watch your belly pain for any changes. Drink enough fluid to keep your pee (urine) pale yellow. Keep all follow-up visits as told by your doctor. This is important. Contact a doctor if: Your belly pain changes or gets worse. You are not hungry, or you lose weight without trying. You are having trouble pooping (constipated) or have watery poop (diarrhea) for more than 2-3 days. You have pain when you pee or poop. Your belly pain wakes you up at night. Your pain gets worse with meals, after eating, or with certain foods. You are vomiting and cannot keep anything down. You have a fever. You have blood in your pee. Get help right away if: Your pain does not go away as soon as your doctor says it should. You cannot stop vomiting. Your pain is only in areas of your belly, such as the right side or the left lower part of the belly. You have bloody or black poop, or poop that looks like tar. You have very bad pain, cramping, or bloating in your belly. You have signs of not having enough fluid or water in your body (dehydration), such as: Dark pee, very little pee, or no pee. Cracked lips. Dry mouth. Sunken eyes. Sleepiness. Weakness. You have trouble breathing or chest pain. Summary Many cases of  belly pain can be watched and treated at home. Watch your belly pain for any changes. Take over-the-counter and prescription medicines only as told by your doctor. Contact a doctor if your belly pain changes or gets worse. Get help right away if you have very bad pain, cramping, or bloating in your belly.  Take care,  Jarold Motto PA-C

## 2020-11-05 NOTE — Progress Notes (Signed)
Antonio Santiago is a 45 y.o. male here for a new problem.   History of Present Illness:   Chief Complaint  Patient presents with   Back Pain    Pt having pain left flank area. Hx Kidney stones 4 yrs ago.    HPI  Left flank pain Patient presents today with a chief complaint of experiencing ongoing back pain. Onset last for 5 days. Denies hematuria, changes to bowel movements, pain with urination, or any changes in appetite. He did have a low grade fever a week ago - 99.5 degrees Farenheit. Marland Kitchen He states that the pain started in the lower lumbar region on the left side and radiates to the front left abdominal region.  He states that this is not his typical back pain that he usually experiences. Patient had imaging done 4 years ago in [place] - noted kidney stones in imaging. He takes Advil and diclofenac (Voltaren). He feels pain when taking deep breaths.Pain rated an 8/10. He was also triaged on 11/04/20 for left sided back pain and left abdominal pain. At that time patient denied fever, vomiting or diarrhea. Pain was rated 9/10. He was advised to visit the ER - patient did not go.    Pain is worsening with time. Denies poor appetite, nausea, changes in bowel, penile/scrotal pain  History reviewed. No pertinent past medical history.   Social History   Tobacco Use   Smoking status: Never   Smokeless tobacco: Never  Substance Use Topics   Alcohol use: Yes    Comment: very rare    History reviewed. No pertinent surgical history.  Family History  Problem Relation Age of Onset   Esophageal cancer Father    Stomach cancer Father     No Known Allergies  Current Medications:   Current Outpatient Medications:    D3-50 1.25 MG (50000 UT) capsule, Take 50,000 Units by mouth once a week., Disp: , Rfl:    diclofenac (VOLTAREN) 75 MG EC tablet, Take 1 tablet (75 mg total) by mouth 2 (two) times daily as needed for moderate pain., Disp: 90 tablet, Rfl: 0   methocarbamol (ROBAXIN) 500 MG  tablet, Take 1 tablet (500 mg total) by mouth 3 (three) times daily as needed for muscle spasms., Disp: 90 tablet, Rfl: 0   rizatriptan (MAXALT) 10 MG tablet, Take 1 tablet (10 mg total) by mouth as needed for migraine. May repeat in 2 hours if needed, Disp: 10 tablet, Rfl: 2   Review of Systems:   ROS Negative unless otherwise specified per HPI.  Vitals:   Vitals:   11/05/20 0834  BP: 110/78  Pulse: 76  Temp: 98.4 F (36.9 C)  TempSrc: Temporal  SpO2: 97%  Weight: 166 lb 8 oz (75.5 kg)  Height: 5\' 7"  (1.702 m)     Body mass index is 26.08 kg/m.  Physical Exam:   Physical Exam Vitals and nursing note reviewed.  Constitutional:      General: He is not in acute distress.    Appearance: He is well-developed. He is not ill-appearing or toxic-appearing.  Cardiovascular:     Rate and Rhythm: Normal rate and regular rhythm.     Pulses: Normal pulses.     Heart sounds: Normal heart sounds, S1 normal and S2 normal.     Comments: No LE edema Pulmonary:     Effort: Pulmonary effort is normal.     Breath sounds: Normal breath sounds.  Abdominal:     General: Abdomen is flat.  Palpations: Abdomen is soft.     Tenderness: There is abdominal tenderness in the left lower quadrant. There is no right CVA tenderness or left CVA tenderness.  Musculoskeletal:     Comments: Left flank tenderness. Limited exam due to significant pain.   Skin:    General: Skin is warm and dry.  Neurological:     Mental Status: He is alert.     GCS: GCS eye subscore is 4. GCS verbal subscore is 5. GCS motor subscore is 6.  Psychiatric:        Speech: Speech normal.        Behavior: Behavior normal. Behavior is cooperative.   Results for orders placed or performed in visit on 11/05/20  POCT Urinalysis Dipstick (Automated)  Result Value Ref Range   Color, UA amber    Clarity, UA clear    Glucose, UA Negative Negative   Bilirubin, UA Negative    Ketones, UA Negative    Spec Grav, UA 1.020 1.010  - 1.025   Blood, UA Negative    pH, UA 6.0 5.0 - 8.0   Protein, UA Negative Negative   Urobilinogen, UA 0.2 0.2 or 1.0 E.U./dL   Nitrite, UA Negative    Leukocytes, UA Negative Negative     Assessment and Plan:   Left flank pain; Lower abdominal pain UA negative Ddx includes kidney stone, colitis, diverticulitis, muscle strain, intraabdominal infection, among others CT scan ordered; CBC and CMP pending Discussed with patient, if any worsening sx in the interim, needs to go to the ER Toradol injection done today  I,Harris Phan,acting as a scribe for [employer], PA.,have documented all relevant documentation on the behalf of Jarold Motto, PA,as directed by  Jarold Motto, PA while in the presence of Jarold Motto, [place].  I, Jarold Motto, [place], have reviewed all documentation for this visit. The documentation on 11/05/20 for the exam, diagnosis, procedures, and orders are all accurate and complete. Jarold Motto, PA-C

## 2020-11-06 LAB — URINE CULTURE
MICRO NUMBER:: 12378944
Result:: NO GROWTH
SPECIMEN QUALITY:: ADEQUATE

## 2021-01-08 ENCOUNTER — Other Ambulatory Visit: Payer: Self-pay | Admitting: Physician Assistant

## 2021-01-08 ENCOUNTER — Other Ambulatory Visit: Payer: Self-pay | Admitting: Registered Nurse

## 2021-01-08 DIAGNOSIS — M5441 Lumbago with sciatica, right side: Secondary | ICD-10-CM

## 2021-01-11 NOTE — Telephone Encounter (Signed)
Will defer to you on these fills  Thanks,  Luan Pulling

## 2021-01-12 MED ORDER — DICLOFENAC SODIUM 75 MG PO TBEC: 75.0000 mg | DELAYED_RELEASE_TABLET | Freq: Two times a day (BID) | ORAL | 0 refills | Status: DC | PRN

## 2021-01-12 MED ORDER — METHOCARBAMOL 500 MG PO TABS: 500.0000 mg | ORAL_TABLET | Freq: Three times a day (TID) | ORAL | 0 refills | Status: DC | PRN

## 2021-04-14 ENCOUNTER — Encounter: Payer: Self-pay | Admitting: Physician Assistant

## 2021-04-14 ENCOUNTER — Other Ambulatory Visit: Payer: Self-pay

## 2021-04-14 ENCOUNTER — Ambulatory Visit (INDEPENDENT_AMBULATORY_CARE_PROVIDER_SITE_OTHER): Payer: 59 | Admitting: Physician Assistant

## 2021-04-14 ENCOUNTER — Ambulatory Visit (INDEPENDENT_AMBULATORY_CARE_PROVIDER_SITE_OTHER): Payer: 59 | Admitting: Sports Medicine

## 2021-04-14 VITALS — BP 110/70 | HR 76 | Temp 98.6°F | Ht 67.0 in | Wt 167.4 lb

## 2021-04-14 VITALS — BP 110/70 | HR 76 | Ht 67.0 in | Wt 167.0 lb

## 2021-04-14 DIAGNOSIS — M546 Pain in thoracic spine: Secondary | ICD-10-CM

## 2021-04-14 DIAGNOSIS — E559 Vitamin D deficiency, unspecified: Secondary | ICD-10-CM | POA: Diagnosis not present

## 2021-04-14 DIAGNOSIS — G43809 Other migraine, not intractable, without status migrainosus: Secondary | ICD-10-CM

## 2021-04-14 DIAGNOSIS — M5431 Sciatica, right side: Secondary | ICD-10-CM | POA: Diagnosis not present

## 2021-04-14 DIAGNOSIS — Z1589 Genetic susceptibility to other disease: Secondary | ICD-10-CM | POA: Diagnosis not present

## 2021-04-14 DIAGNOSIS — G5701 Lesion of sciatic nerve, right lower limb: Secondary | ICD-10-CM | POA: Diagnosis not present

## 2021-04-14 DIAGNOSIS — G8929 Other chronic pain: Secondary | ICD-10-CM

## 2021-04-14 LAB — VITAMIN D 25 HYDROXY (VIT D DEFICIENCY, FRACTURES): VITD: 41.14 ng/mL (ref 30.00–100.00)

## 2021-04-14 MED ORDER — MELOXICAM 15 MG PO TABS: 15.0000 mg | ORAL_TABLET | Freq: Every day | ORAL | 0 refills | Status: DC

## 2021-04-14 MED ORDER — RIZATRIPTAN BENZOATE 10 MG PO TABS: 10.0000 mg | ORAL_TABLET | ORAL | 2 refills | Status: DC | PRN

## 2021-04-14 MED ORDER — CYCLOBENZAPRINE HCL 10 MG PO TABS: 10.0000 mg | ORAL_TABLET | Freq: Every evening | ORAL | 0 refills | Status: DC | PRN

## 2021-04-14 NOTE — Patient Instructions (Addendum)
Good to see you  Stop diclofenac (Voltaren) no NSAID  Start meloxicam 15 mg daily x2 weeks.  If still having pain after 2 weeks, complete 3rd-week of meloxicam. May use remaining meloxicam as needed once daily for pain control.  Do not to use additional NSAIDs while taking meloxicam.  May use Tylenol [phone] mg to 3 times a day for breakthrough pain. Start flexeril  10 mg at night for muscle spasm  Sciatica and piriformis HEP  3 week follow up

## 2021-04-14 NOTE — Progress Notes (Signed)
Antonio Santiago D.[facility] [place] Phone: [phone]   Assessment and Plan:    1. Sciatica of right side 2. Piriformis syndrome of right side -Chronic with exacerbation, initial sports medicine visit - Consistent with right-sided sciatica with irritation at piriformis/gluteal musculature based on HPI, physical exam, prior imaging -- Start meloxicam 15 mg daily x2 weeks.  If still having pain after 2 weeks, complete 3rd-week of meloxicam. May use remaining meloxicam as needed once daily for pain control.  Do not to use additional NSAIDs while taking meloxicam.  May use Tylenol [phone] mg to 3 times a day for breakthrough pain. - Start Flexeril 10 mg nightly for muscle spasms - Start HEP for sciatica  3.  HLA-B27 positive - I feel that patient's HPI, physical exam are more consistent with a piriformis and gluteal cause of sciatica and not a lumbar cause.  Lumbar MRI from 01/2020 did not show degeneration around L4-L5 region, which further supports a more lateral cause of sciatica.  If no improvement at follow-up visit, would order lumbar x-ray to review if there has been any progression - Continue follow-ups with Dr. Paulla Fore for treatment   Pertinent previous records reviewed include PCP note from 04/14/2021, lumbar MRI 02/10/2020, sick MRI 02/18/2020   Follow Up: 3 weeks for reevaluation.  If no better or worsening of symptoms, would order lumbar x-ray, could consider piriformis CSI +/- formal PT   Subjective:   I, Antonio Santiago, am serving as a Education administrator for Doctor Glennon Mac  Chief Complaint: back pain   HPI:   04/14/21 Patient is a 46 year old male complaininig of back pain . Patient states  that he has a low back pain on the right side, been going on for a month , no MOI  has been going on and off for a long time , the pain never changes , does radiate down the leg, 2 days ago pain radiated down to his heel ,  today pain is located right in the low back  ,3-4 days ago he had some tingling but only for the day has tried taking ib and tylenol has taken Voltaren gel, had a CSI in the arm but no change.  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   Current Outpatient Medications:    cyclobenzaprine (FLEXERIL) 10 MG tablet, Take 1 tablet (10 mg total) by mouth at bedtime as needed for muscle spasms., Disp: 21 tablet, Rfl: 0   meloxicam (MOBIC) 15 MG tablet, Take 1 tablet (15 mg total) by mouth daily., Disp: 30 tablet, Rfl: 0   diclofenac (VOLTAREN) 75 MG EC tablet, Take 1 tablet (75 mg total) by mouth 2 (two) times daily as needed for moderate pain., Disp: 90 tablet, Rfl: 0   rizatriptan (MAXALT) 10 MG tablet, Take 1 tablet (10 mg total) by mouth as needed for migraine. May repeat in 2 hours if needed, Disp: 10 tablet, Rfl: 2   Objective:     Vitals:   04/14/21 1037  BP: 110/70  Pulse: 76  SpO2: 97%  Weight: 167 lb (75.8 kg)  Height: _0  (1.702 m)      Body mass index is 26.16 kg/m.    Physical Exam:    Gen: Appears well, nad, nontoxic and pleasant Psych: Alert and oriented, appropriate mood and  affect Neuro: sensation intact, strength is 5/5 in upper and lower extremities, muscle tone wnl Skin: no susupicious lesions or rashes  Back - Normal skin, Spine with normal alignment and no deformity.   No tenderness to vertebral process palpation.   Paraspinous muscles are not tender and without spasm Straight leg raise negative Trendelenberg negative  Positive piriformis test on right TTP gluteal musculature on right NTTP greater trochanter  Electronically signed by:  Antonio Santiago D.[facility] 11:08 AM 04/14/21

## 2021-04-14 NOTE — Progress Notes (Signed)
Antonio Santiago is a 46 y.o. male here for chronic back pain.  History of Present Illness:   Chief Complaint  Patient presents with   Back Pain    PT c/o right lower back radiating into leg x 3 weeks. He is using Diclofenac, Voltaren gel.   HPI  Chronic Back Pain Antonio Santiago has been seen by me for this issue on 01/21/20, 04/20/20, and 05/15/20. During initial visit, pt explained that 7 years ago he underwent an MRI in [place] that showed a possible herniated disc in cervical, thoracic, and lumbar spine. At that time he had undergone spinal injections and pain overall was well controlled until returning on 01/16/20. Due to past hx and failed attempts at additional relief with Voltaren gel, laser therapy with chiropractor, and Advil, I ordered an updated MRI of his back. Findings of this imaging below:  Cervical --  1. Right eccentric disc bulge with uncovertebral hypertrophy at C6-7 with resultant mild canal, with moderate right C7 foraminal stenosis. Finding could contribute to right upper extremity radicular symptoms. 2. Left paracentral disc protrusion at C5-6 contacting and flattening the left hemi cord, with resultant mild canal and moderate left C6 foraminal stenosis. 3. Small central disc protrusions at C3-4 and C4-5 with resultant mild spinal stenosis.   Thoracic -- 1. Small right paracentral to foraminal disc protrusions at T5-6 and T7-8 without significant stenosis or neural impingement. 2. Additional minimal degenerative disc bulging elsewhere within the thoracic spine as above without significant spinal stenosis or cord deformity. No significant foraminal encroachment.   Lumbar --  1. Mild reactive endplate change about the L1-2 space anteriorly, which could contribute to underlying back pain. 2. Otherwise essentially normal MRI of the lumbar spine for age. No significant disc pathology, stenosis, or evidence for neural impingement.  Due to results, I referred pt to neurosurgery for  further evaluation and management. Unfortunately due to insurance complications, he never followed up with neurosurgery and in turn had this referral closed after finding someone else. On 07/30/20, he visited with Maximiano Coss, NP where it was reported that Antonio Santiago was experiencing flares of lower right back pain with right sided sciatica with some thoracic tenderness. After further evaluation pt was administered Depo-Medrol 80 mg injection that he tolerated well yet didn't find as beneficial.   Additionally he was prescribed diclofenac 75 mg BID prn and robaxin 500 mg TID prn daily. According to Maximiano Coss, NP, he was working on non-pharmaceutical relief with Dr. Paulla Fore, but has found he needed additional relief.  Due to continuation of back pain, Antonio Santiago returned to Dr. Paulla Fore on 11/06/20 where he participated in OMT based on physical examination. He was also restarted on diclofenac 75 mg twice daily x 1-2 weeks then prn. He was also recommended to participate in PT but this was never completed. At this time pt returns to me looking for additional relief. States he has been reicieving massages and was recommended to complete steroid injections since nothing else provided him with relief. Denies numbness/tingling, bowel.urinary incontinence, or spinal tenderness.  Of note, patient is HLA-B27 positive -- this was checked 05/21/20.  Migraines Pt has been compliant with taking maxalt 10 mg as needed for migraines with no complications. States he has been experiencing migraines about once or twice a week but finds relief when he takes this medication. Despite frequency of migraines, he is managing well and will need a refill of his medication. Denies vision changes, lightheadedness, dizziness, changes in pain or changes in frequency of  migraines.   Vitamin D Deficiency Antonio Santiago has been compliant with taking D3 50000 units daily with no adverse effects. Due to recently running out of this supplement, he  would like to re-check his levels to tell if he needs to continue this. Denies concerning sx.   History reviewed. No pertinent past medical history.   Social History   Tobacco Use   Smoking status: Never   Smokeless tobacco: Never  Substance Use Topics   Alcohol use: Yes    Comment: very rare    History reviewed. No pertinent surgical history.  Family History  Problem Relation Age of Onset   Esophageal cancer Father    Stomach cancer Father     No Known Allergies  Current Medications:   Current Outpatient Medications:    diclofenac (VOLTAREN) 75 MG EC tablet, Take 1 tablet (75 mg total) by mouth 2 (two) times daily as needed for moderate pain., Disp: 90 tablet, Rfl: 0   rizatriptan (MAXALT) 10 MG tablet, Take 1 tablet (10 mg total) by mouth as needed for migraine. May repeat in 2 hours if needed, Disp: 10 tablet, Rfl: 2   Review of Systems:   ROS Negative unless otherwise specified per HPI.  Vitals:   Vitals:   04/14/21 0955  BP: 110/70  Pulse: 76  Temp: 98.6 F (37 C)  TempSrc: Temporal  SpO2: 97%  Weight: 167 lb 6.1 oz (75.9 kg)  Height: 5' 7" (1.702 m)     Body mass index is 26.22 kg/m.  Physical Exam:   Physical Exam Vitals and nursing note reviewed.  Constitutional:      General: He is not in acute distress.    Appearance: He is well-developed. He is not ill-appearing or toxic-appearing.  Cardiovascular:     Rate and Rhythm: Normal rate and regular rhythm.     Pulses: Normal pulses.     Heart sounds: Normal heart sounds, S1 normal and S2 normal.  Pulmonary:     Effort: Pulmonary effort is normal.     Breath sounds: Normal breath sounds.  Musculoskeletal:     Comments: Deferred -- we were able to secure appointment with sports medicine today  Skin:    General: Skin is warm and dry.  Neurological:     Mental Status: He is alert.     GCS: GCS eye subscore is 4. GCS verbal subscore is 5. GCS motor subscore is 6.  Psychiatric:        Speech:  Speech normal.        Behavior: Behavior normal. Behavior is cooperative.    Assessment and Plan:   Chronic midline thoracic back pain He declined my interventions today (oral prednisone, toradol/depomedrol injection) Referral to sports medicine -- appreciate coordination of care  Other migraine without status migrainosus, not intractable Controlled; no red flags Continue maxalt 10 mg daily  Follow up as needed  Vitamin D deficiency Update labs, will make recommendations accordingly  Low threshold to restart Vit D high dose supplement.  I,Havlyn C Ratchford,acting as a Education administrator for [employer], PA.,have documented all relevant documentation on the behalf of Inda Coke, PA,as directed by  Inda Coke, PA while in the presence of Inda Coke, [place].  I, Inda Coke, [place], have reviewed all documentation for this visit. The documentation on 04/14/21 for the exam, diagnosis, procedures, and orders are all accurate and complete.   Inda Coke, PA-C

## 2021-04-14 NOTE — Patient Instructions (Signed)
It was great to see you!  Please go to [facility] His location: Psychiatrist Medicine at [facility] [address] on the 1st floor.   Phone number [phone], Fax [phone].  This location is across the street from the entrance to [employer] and in the same complex as the [facility] and [employer]  I have refilled your migraine medications.  Take care,  Jarold Motto PA-C

## 2021-04-19 ENCOUNTER — Ambulatory Visit: Payer: 59 | Admitting: Physician Assistant

## 2021-05-04 NOTE — Progress Notes (Signed)
? ? Antonio Santiago Antonio Santiago ?[facility] ?[place] ?Phone: [phone] ?  ?Assessment and Plan:   ? ?1. Sciatica of right side ?2. Piriformis syndrome of right side ?-Chronic with significant improvement, subsequent visit ?- Significant improvement in sciatica-like symptoms after completion of course of meloxicam and Flexeril and HEP ?- Discontinue daily use of Flexeril and meloxicam.  May use remainder as needed for pain or muscle spasms ?- Recommend starting Tylenol 500- 1000 mg 2 to ice 3 times a day for day-to-day pain relief ? ?3. HLA-B27 positive ?4. Chronic bilateral thoracic back pain ?5. Chronic bilateral low back pain without sciatica ?6.  Thoracic somatic dysfunction ?7.  Lumbar somatic dysfunction ?8.  Pelvic somatic dysfunction ?-Chronic with exacerbation ?- Although patient's symptoms of sciatica improved since last visit, his overall back pain has significantly worsened and he is TTP throughout lumbar and thoracic spine ?- Patient has positive HLA-B27 that has not been evaluated by rheumatology.  We will send a rheumatology referral today ?- Patient had MRI in 2021 that did not show significant etiology, however possible that ankylosing spondylitis may be patient's underlying cause of current back pain ?- Start Tylenol 500 to 1000 mg 2-3 times a day for pain relief until evaluated by rheumatology ?- Patient has received significant relief with OMT in the past.  Elects for repeat OMT today.  Tolerated well per note below. ?- Decision today to treat with OMT was based on Physical Exam ? ?After verbal consent patient was treated with HVLA (high velocity low amplitude), ME (muscle energy), FPR (flex positional release), ST (soft tissue), PC/PD (Pelvic Compression/ Pelvic Decompression) techniques in   thoracic, lumbar, and pelvic areas. Patient tolerated the procedure well with improvement in symptoms.  Patient educated on potential side effects of  soreness and recommended to rest, hydrate, and use Tylenol as needed for pain control.  ?  ?Pertinent previous records reviewed include lab work including HLA-B27 positive on 05/21/2020 ?  ?Follow Up: After rheumatology visit to discuss results and treatment plan ?  ?Subjective:   ?I, Judy Pimple, am serving as a scribe for Dr. Glennon Mac ? ?Chief Complaint: right side low back pain  ? ?HPI:  ?  ?04/14/21 ?Patient is a 46 year old male complaininig of back pain . Patient states  that he has a low back pain on the right side, been going on for a month , no MOI  has been going on and off for a long time , the pain never changes , does radiate down the leg, 2 days ago pain radiated down to his heel , today pain is located right in the low back  ,3-4 days ago he had some tingling but only for the day has tried taking ib and tylenol has taken Voltaren gel, had a CSI in the arm but no change.  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 ?05/05/2021 ?Patient states that he has is doing better the pain does not radiate all the way to his heel anymore. States it radiates to right above his knee. Patient states that the standing at work has gotten better as well, feels like the medication is helping.  ? ?Relevant Historical Information: HLA-B27 positive ? ?Additional pertinent review of systems negative. ? ? ?Current Outpatient Medications:  ?  cyclobenzaprine (FLEXERIL) 10 MG tablet, Take 1 tablet (10 mg total) by mouth at bedtime as needed for  muscle spasms., Disp: 21 tablet, Rfl: 0 ?  diclofenac (VOLTAREN) 75 MG EC tablet, Take 1 tablet (75 mg total) by mouth 2 (two) times daily as needed for moderate pain., Disp: 90 tablet, Rfl: 0 ?  meloxicam (MOBIC) 15 MG tablet, Take 1 tablet (15 mg total) by mouth daily., Disp: 30 tablet, Rfl: 0 ?  rizatriptan (MAXALT) 10 MG tablet, Take 1 tablet (10 mg total) by mouth as needed for migraine. May repeat in 2 hours if needed, Disp: 10 tablet,  Rfl: 2  ? ?Objective:   ?  ?Vitals:  ? 05/05/21 1045  ?BP: 120/82  ?Pulse: 86  ?SpO2: 98%  ?Weight: 163 lb (73.9 kg)  ?Height: 5' 7"  (1.702 m)  ?  ?  ?Body mass index is 25.53 kg/m?.  ?  ?Physical Exam:   ? ?General: Well-appearing, cooperative, sitting comfortably in no acute distress.  ? ?OMT Physical Exam: ? ?ASIS Compression Test: Positive Right ?  ?Thoracic: TTP paraspinal, T8-L2 RRSL ?Lumbar: TTP paraspinal, T8-L2 RRSL ?Pelvis: Right anterior innominate  ? ? ?Electronically signed by:  ?Antonio Santiago Antonio Santiago ?[facility] ?11:30 AM 05/05/21 ?

## 2021-05-05 ENCOUNTER — Ambulatory Visit (INDEPENDENT_AMBULATORY_CARE_PROVIDER_SITE_OTHER): Payer: 59 | Admitting: Sports Medicine

## 2021-05-05 ENCOUNTER — Other Ambulatory Visit: Payer: Self-pay

## 2021-05-05 VITALS — BP 120/82 | HR 86 | Ht 67.0 in | Wt 163.0 lb

## 2021-05-05 DIAGNOSIS — M546 Pain in thoracic spine: Secondary | ICD-10-CM | POA: Diagnosis not present

## 2021-05-05 DIAGNOSIS — G8929 Other chronic pain: Secondary | ICD-10-CM

## 2021-05-05 DIAGNOSIS — M9902 Segmental and somatic dysfunction of thoracic region: Secondary | ICD-10-CM

## 2021-05-05 DIAGNOSIS — M5431 Sciatica, right side: Secondary | ICD-10-CM | POA: Diagnosis not present

## 2021-05-05 DIAGNOSIS — M9905 Segmental and somatic dysfunction of pelvic region: Secondary | ICD-10-CM

## 2021-05-05 DIAGNOSIS — Z1589 Genetic susceptibility to other disease: Secondary | ICD-10-CM | POA: Diagnosis not present

## 2021-05-05 DIAGNOSIS — M545 Low back pain, unspecified: Secondary | ICD-10-CM

## 2021-05-05 DIAGNOSIS — G5701 Lesion of sciatic nerve, right lower limb: Secondary | ICD-10-CM

## 2021-05-05 DIAGNOSIS — M9903 Segmental and somatic dysfunction of lumbar region: Secondary | ICD-10-CM

## 2021-05-05 NOTE — Patient Instructions (Addendum)
Good to see you  ?Referral to rheumatology placed for HLA B27 positive marker ?Follow up in our office after you see rheumatology ?Use tylenol 500-1064m 2-3 times a day as needed for the pain  ?

## 2021-05-13 ENCOUNTER — Encounter: Payer: Self-pay | Admitting: Physician Assistant

## 2021-07-20 ENCOUNTER — Other Ambulatory Visit: Payer: Self-pay | Admitting: Sports Medicine

## 2021-07-20 DIAGNOSIS — M546 Pain in thoracic spine: Secondary | ICD-10-CM | POA: Diagnosis not present

## 2021-07-20 DIAGNOSIS — M545 Low back pain, unspecified: Secondary | ICD-10-CM | POA: Diagnosis not present

## 2021-07-20 DIAGNOSIS — E559 Vitamin D deficiency, unspecified: Secondary | ICD-10-CM | POA: Diagnosis not present

## 2021-08-18 ENCOUNTER — Encounter: Payer: Self-pay | Admitting: Physician Assistant

## 2021-08-18 ENCOUNTER — Ambulatory Visit (INDEPENDENT_AMBULATORY_CARE_PROVIDER_SITE_OTHER): Payer: 59 | Admitting: Physician Assistant

## 2021-08-18 VITALS — BP 110/66 | HR 70 | Temp 98.4°F | Ht 67.0 in | Wt 159.5 lb

## 2021-08-18 DIAGNOSIS — Z1211 Encounter for screening for malignant neoplasm of colon: Secondary | ICD-10-CM

## 2021-08-18 DIAGNOSIS — R1030 Lower abdominal pain, unspecified: Secondary | ICD-10-CM

## 2021-08-18 DIAGNOSIS — E559 Vitamin D deficiency, unspecified: Secondary | ICD-10-CM | POA: Diagnosis not present

## 2021-08-18 DIAGNOSIS — Z1159 Encounter for screening for other viral diseases: Secondary | ICD-10-CM

## 2021-08-18 DIAGNOSIS — E781 Pure hyperglyceridemia: Secondary | ICD-10-CM

## 2021-08-18 LAB — COMPREHENSIVE METABOLIC PANEL
ALT: 20 U/L (ref 0–53)
AST: 19 U/L (ref 0–37)
Albumin: 4.3 g/dL (ref 3.5–5.2)
Alkaline Phosphatase: 60 U/L (ref 39–117)
BUN: 11 mg/dL (ref 6–23)
CO2: 31 mEq/L (ref 19–32)
Calcium: 9.2 mg/dL (ref 8.4–10.5)
Chloride: 103 mEq/L (ref 96–112)
Creatinine, Ser: 0.83 mg/dL (ref 0.40–1.50)
GFR: 106.02 mL/min (ref >60.00)
Glucose, Bld: 87 mg/dL (ref 70–99)
Potassium: 4.1 mEq/L (ref 3.5–5.1)
Sodium: 140 mEq/L (ref 135–145)
Total Bilirubin: 0.7 mg/dL (ref 0.2–1.2)
Total Protein: 7 g/dL (ref 6.0–8.3)

## 2021-08-18 LAB — URINALYSIS
Bilirubin Urine: NEGATIVE
Hgb urine dipstick: NEGATIVE
Ketones, ur: NEGATIVE
Leukocytes,Ua: NEGATIVE
Nitrite: NEGATIVE
Specific Gravity, Urine: 1.015 (ref 1.000–1.030)
Total Protein, Urine: NEGATIVE
Urine Glucose: NEGATIVE
Urobilinogen, UA: 0.2 (ref 0.0–1.0)
pH: 7 (ref 5.0–8.0)

## 2021-08-18 LAB — LIPID PANEL
Cholesterol: 174 mg/dL (ref 0–200)
HDL: 50.1 mg/dL (ref >39.00)
LDL Cholesterol: 116 mg/dL — ABNORMAL HIGH (ref 0–99)
NonHDL: 124.34
Total CHOL/HDL Ratio: 3
Triglycerides: 44 mg/dL (ref 0.0–149.0)
VLDL: 8.8 mg/dL (ref 0.0–40.0)

## 2021-08-18 LAB — CBC WITH DIFFERENTIAL/PLATELET
Basophils Absolute: 0 10*3/uL (ref 0.0–0.1)
Basophils Relative: 0.3 % (ref 0.0–3.0)
Eosinophils Absolute: 0 10*3/uL (ref 0.0–0.7)
Eosinophils Relative: 0.4 % (ref 0.0–5.0)
HCT: 42.4 % (ref 39.0–52.0)
Hemoglobin: 14.5 g/dL (ref 13.0–17.0)
Lymphocytes Relative: 31.2 % (ref 12.0–46.0)
Lymphs Abs: 1.8 10*3/uL (ref 0.7–4.0)
MCHC: 34.3 g/dL (ref 30.0–36.0)
MCV: 90.4 fl (ref 78.0–100.0)
Monocytes Absolute: 0.4 10*3/uL (ref 0.1–1.0)
Monocytes Relative: 7.8 % (ref 3.0–12.0)
Neutro Abs: 3.4 10*3/uL (ref 1.4–7.7)
Neutrophils Relative %: 60.3 % (ref 43.0–77.0)
Platelets: 224 10*3/uL (ref 150.0–400.0)
RBC: 4.68 Mil/uL (ref 4.22–5.81)
RDW: 12.9 % (ref 11.5–15.5)
WBC: 5.6 10*3/uL (ref 4.0–10.5)

## 2021-08-18 LAB — LIPASE: Lipase: 25 U/L (ref 11.0–59.0)

## 2021-08-18 LAB — VITAMIN D 25 HYDROXY (VIT D DEFICIENCY, FRACTURES): VITD: 80.18 ng/mL (ref 30.00–100.00)

## 2021-08-18 NOTE — Progress Notes (Signed)
Antonio Santiago is a 46 y.o. male here for a follow up of a pre-existing problem.   History of Present Illness:   Chief Complaint  Patient presents with   Abdominal Pain    Pt is left side abdominal pain and some on the right side x 2 months.     HPI  Lower Abdominal Pain  Patient complains of left sided abdominal pain that has been onset for 2 months. States he has been experiencing this pain everyday. He states he also having lower back pain which radiates to the front left abdominal region. He does not know if this related to musculoskeletal pain. Has had fever for 2 days -- last week but this resolved. He was seen for the similar issue on 11/05/2020 and was given Toradol injection at that time. States he has not been drinking enough water. His bowel movements are normal. At this time, he would like to get ultrasound for further evaluation. Denies poor appetite, nausea, changes in bowel, penile/scrotal pain. No vomiting.  He is due for colonoscopy.  Last visit in 11/05/20 we did a CT scan for flank pain and it was overall normal. He continues to suffer from severe back pain. He has seen two different sports medicine providers for this. There are concerns that he may have underlying ankylosing spondylitis that could be causing his significant back pain and he is scheduled to see rheumatology to establish care in a few months.  Hypertriglyceridemia; Vitamin D deficiency Additionally, he would like to update his blood work done. He is due to recheck his lipid panel and vitamin D. Takes 50,000 IU weekly.  History reviewed. No pertinent past medical history.   Social History   Tobacco Use   Smoking status: Never   Smokeless tobacco: Never  Substance Use Topics   Alcohol use: Yes    Comment: very rare    History reviewed. No pertinent surgical history.  Family History  Problem Relation Age of Onset   Esophageal cancer Father    Stomach cancer Father     No Known  Allergies  Current Medications:   Current Outpatient Medications:    Cholecalciferol (VITAMIN D3) 1.25 MG (50000 UT) CAPS, Take 1 capsule by mouth once a week., Disp: , Rfl:    cyclobenzaprine (FLEXERIL) 10 MG tablet, Take 1 tablet (10 mg total) by mouth at bedtime as needed for muscle spasms., Disp: 21 tablet, Rfl: 0   meloxicam (MOBIC) 15 MG tablet, Take 1 tablet (15 mg total) by mouth daily., Disp: 30 tablet, Rfl: 0   rizatriptan (MAXALT) 10 MG tablet, Take 1 tablet (10 mg total) by mouth as needed for migraine. May repeat in 2 hours if needed, Disp: 10 tablet, Rfl: 2   Review of Systems:   ROS Negative unless otherwise specified per HPI.   Vitals:   Vitals:   08/18/21 1053  BP: 110/66  Pulse: 70  Temp: 98.4 F (36.9 C)  TempSrc: Temporal  SpO2: 96%  Weight: 159 lb 8 oz (72.3 kg)  Height: 5\' 7"  (1.702 m)     Body mass index is 24.98 kg/m.  Physical Exam:   Physical Exam Vitals and nursing note reviewed.  Constitutional:      General: He is not in acute distress.    Appearance: He is well-developed. He is not ill-appearing or toxic-appearing.  Cardiovascular:     Rate and Rhythm: Normal rate and regular rhythm.     Pulses: Normal pulses.     Heart sounds: Normal  heart sounds, S1 normal and S2 normal.  Pulmonary:     Effort: Pulmonary effort is normal.     Breath sounds: Normal breath sounds.  Abdominal:     General: Abdomen is flat. Bowel sounds are normal.     Palpations: Abdomen is soft.     Tenderness: There is generalized abdominal tenderness.  Skin:    General: Skin is warm and dry.  Neurological:     Mental Status: He is alert.     GCS: GCS eye subscore is 4. GCS verbal subscore is 5. GCS motor subscore is 6.  Psychiatric:        Speech: Speech normal.        Behavior: Behavior normal. Behavior is cooperative.     Assessment and Plan:   Lower abdominal pain Unclear etiology No evidence of acute abdomen Ddx includes but is not limited to - IBS,  constipation, muscle strain, malignancy, gastritis, PUD, among others Recommend CT abd pel but he is requesting u/s  He is leaving for a 3 week trip to [place] in two days. Will order stat u/s If any worsening symptoms -- needs eval in the ER Update blood work to assess renal/liver function, lytes and lipase Update UA as well  Vitamin D deficiency Update Vit D and provide recommendations accordingly  Encounter for screening for other viral diseases Update Hep C today  Special screening for malignant neoplasms, colon Referral placed  Hypertriglyceridemia Update lipid panel today -- he is fasting   I,Savera Zaman,acting as a scribe for [employer], PA.,have documented all relevant documentation on the behalf of Jarold Motto, PA,as directed by  Jarold Motto, PA while in the presence of Jarold Motto, [place].   I, Jarold Motto, [place], have reviewed all documentation for this visit. The documentation on 08/18/21 for the exam, diagnosis, procedures, and orders are all accurate and complete.  Time spent with patient today was 50 minutes which consisted of chart review of my prior note and recent Sports Medicine note, review of prior CT abd/pel, discussing diagnosis, work up, treatment answering questions and documentation.  Jarold Motto, PA-C

## 2021-08-18 NOTE — Patient Instructions (Signed)
It was great to see you!  We will update your blood work today and your urine test  We will call you tomorrow to schedule the ultrasound  I will place an order to get your colonoscopy ordered  If you feel any worse, please go to the ER  If a referral was placed today --> you will be contacted for an appointment. Please note that routine referrals can sometimes take up to 3-4 weeks to process. Please call our office if you haven't heard anything after this time frame.  If blood work, urine studies, or any imaging was ordered today -->  we will release your results to you on your MyChart account (if you have chosen to sign up for this) with further instructions. You may see the results before I do, but when I review them I will send you a message with my report or have my staff call you if things need to be discussed. Please reply to my message with any questions.   Take care,  Jarold Motto PA-C

## 2021-08-19 ENCOUNTER — Ambulatory Visit (HOSPITAL_BASED_OUTPATIENT_CLINIC_OR_DEPARTMENT_OTHER)
Admission: RE | Admit: 2021-08-19 | Discharge: 2021-08-19 | Disposition: A | Discharge status: Home / Self Care | Payer: 59 | Source: Ambulatory Visit | Attending: Physician Assistant | Admitting: Physician Assistant

## 2021-08-19 ENCOUNTER — Other Ambulatory Visit: Payer: Self-pay | Admitting: Physician Assistant

## 2021-08-19 DIAGNOSIS — R1030 Lower abdominal pain, unspecified: Secondary | ICD-10-CM

## 2021-08-19 LAB — HEPATITIS C ANTIBODY: Hepatitis C Ab: NONREACTIVE

## 2021-08-22 ENCOUNTER — Encounter: Payer: Self-pay | Admitting: Physician Assistant

## 2021-09-17 NOTE — Progress Notes (Signed)
Office Visit Note  Patient: Antonio Santiago             Date of Birth: Jun 26, 1975           MRN: [id number]             PCP: Inda Coke, PA Referring: Glennon Mac, DO Visit Date: 10/01/2021 Occupation: _0 @  Subjective:  Upper and lower back pain.  History of Present Illness: Antonio Santiago is a 46 y.o. male seen in consultation per request of Dr. Glennon Mac.  According to patient his symptoms started in early 46s with the right hip pain.  He states at the time he was seeing a Restaurant manager, fast food.  With the manipulations he states the pain got worse and he started having upper and lower back pain.  Gradually started having neck pain.  He states that at age 4 he was diagnosed with cervical disc herniation.  He had a cortisone injection which was helpful.  He states 5 years ago he moved here and he was having increased back and neck pain.  He had MRI of his entire spine in 2021.  He was diagnosed with degenerative disc disease.  He gives history of nocturnal pain and morning stiffness.  He states the stiffness gets better during the daytime.  He denies any history of Achilles tendinitis, Planter fasciitis, uveitis or shortness of breath.  There is no history of psoriasis or constipation or diarrhea.  He denies any history of joint swelling.  He states his mother had back issues but no one was diagnosed with ankylosing spondylitis.  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  He states he recently started doing yoga stretches which has been helpful and the mobility in his spine is improved.  He also does weightlifting.  He is unable to walk for a long time due to back pain.  He has 2 sisters and a 75 years old daughter who are in good health.  Activities of Daily Living:  Patient reports morning stiffness for 30 minutes.   Patient Reports nocturnal pain.  Difficulty dressing/grooming: Denies Difficulty climbing stairs: Reports Difficulty getting out of chair: Reports Difficulty using hands for  taps, buttons, cutlery, and/or writing: Denies  Review of Systems  Constitutional:  Negative for fatigue.  HENT:  Negative for mouth sores and mouth dryness.   Eyes:  Negative for dryness.  Respiratory:  Negative for shortness of breath.   Cardiovascular:  Negative for chest pain and palpitations.  Gastrointestinal:  Negative for blood in stool, constipation and diarrhea.  Endocrine: Negative for increased urination.  Genitourinary:  Negative for involuntary urination.  Musculoskeletal:  Positive for joint pain, joint pain, myalgias, morning stiffness, muscle tenderness and myalgias. Negative for gait problem, joint swelling and muscle weakness.  Skin:  Negative for color change, rash, hair loss and sensitivity to sunlight.  Allergic/Immunologic: Negative for susceptible to infections.  Neurological:  Negative for dizziness and headaches.  Hematological:  Negative for swollen glands.  Psychiatric/Behavioral:  Negative for depressed mood and sleep disturbance. The patient is not nervous/anxious.     PMFS History:  Patient Active Problem List   Diagnosis Date Noted   HLA B27 (HLA B27 positive) 04/14/2021   Vitamin D deficiency 05/12/2020   Migraine 06/26/2019   Chronic midline thoracic back pain 06/26/2019   Overweight 06/26/2019    History reviewed. No pertinent past medical history.  Family History  Problem Relation Age of Onset   Esophageal cancer Father    Stomach cancer Father  Healthy Sister    Healthy Sister    Healthy Daughter    History reviewed. No pertinent surgical history. Social History   Social History Narrative   Not on file   Immunization History  Administered Date(s) Administered   Tdap 02/22/2016     Objective: Vital Signs: BP 114/75 (BP Location: Right Arm, Patient Position: Sitting, Cuff Size: Normal)   Pulse 60   Resp 15   Ht 5' 8" (1.727 m)   Wt 162 lb 6.4 oz (73.7 kg)   BMI 24.69 kg/m    Physical Exam Vitals and nursing note reviewed.   Constitutional:      Appearance: He is well-developed.  HENT:     Head: Normocephalic and atraumatic.  Eyes:     Conjunctiva/sclera: Conjunctivae normal.     Pupils: Pupils are equal, round, and reactive to light.  Cardiovascular:     Rate and Rhythm: Normal rate and regular rhythm.     Heart sounds: Normal heart sounds.  Pulmonary:     Effort: Pulmonary effort is normal.     Breath sounds: Normal breath sounds.  Abdominal:     General: Bowel sounds are normal.     Palpations: Abdomen is soft.  Musculoskeletal:     Cervical back: Normal range of motion and neck supple.  Skin:    General: Skin is warm and dry.     Capillary Refill: Capillary refill takes less than 2 seconds.  Neurological:     Mental Status: He is alert and oriented to person, place, and time.  Psychiatric:        Behavior: Behavior normal.      Musculoskeletal Exam: Good range of motion of the cervical spine.  He had good range of motion of the thoracic spine with discomfort.  He had limited forward flexion but Schober's was negative.  He had mild tenderness over SI joints.  There was no point tenderness over the spine.  Shoulder joints, elbow joints, wrist joints, MCPs PIPs and DIPs with good range of motion with no synovitis.  Hip joints, knee joints, ankles, MTPs and PIPs with good range of motion with no synovitis.  CDAI Exam: CDAI Score: -- Patient Global: --; Provider Global: -- Swollen: --; Tender: -- Joint Exam 10/01/2021   No joint exam has been documented for this visit   There is currently no information documented on the homunculus. Go to the Rheumatology activity and complete the homunculus joint exam.  Investigation: No additional findings.  Imaging: No results found.  Recent Labs: Lab Results  Component Value Date   WBC 5.6 08/18/2021   HGB 14.5 08/18/2021   PLT 224.0 08/18/2021   NA 140 08/18/2021   K 4.1 08/18/2021   CL 103 08/18/2021   CO2 31 08/18/2021   GLUCOSE 87  08/18/2021   BUN 11 08/18/2021   CREATININE 0.83 08/18/2021   BILITOT 0.7 08/18/2021   ALKPHOS 60 08/18/2021   AST 19 08/18/2021   ALT 20 08/18/2021   PROT 7.0 08/18/2021   ALBUMIN 4.3 08/18/2021   CALCIUM 9.2 08/18/2021    Speciality Comments: No specialty comments available.  Procedures:  No procedures performed Allergies: Patient has no known allergies.   Assessment / Plan:     Visit Diagnoses: DDD (degenerative disc disease), cervical-patient complains of pain and discomfort in his cervical region for many years.  He had MRI of the cervical spine on February 09, 2020 which I reviewed.  It showed multilevel spondylosis with C5-C6 disc protrusion and  mild spinal stenosis.  He also had C3-C4 and C4-C5 small disc protrusions with mild spinal stenosis.  Chronic midline thoracic back pain -he complains of thoracic pain since he was in his mid 36s.  He states he has nocturnal pain and significant morning stiffness.  He had difficulty laying down on the exam table.  He has been going to chiropractor in the past and now he goes to sports medicine where he gets manipulations.  He has been taking meloxicam and Flexeril without much relief.  He states yoga stretches have been helpful.  He had MRI of the thoracic spine on February 09, 2020 which showed right paracentral to foraminal disc protrusion at T5-T6 and T7-T8 without spinal stenosis.  He states the pain from the mid thoracic region radiates to his right rib cage.  Plan: XR Thoracic Spine 2 View, x-ray showed degenerative changes with multiple osteophytes which could be consistent with DISH disease.  Possible syndesmophyte was noted to have T11-T12 level.  Sedimentation rate, C-reactive protein  Chronic midline low back pain with bilateral sciatica -he gives history of chronic lower back pain.  He gives history of nocturnal pain and morning stiffness.  He MRI of the lumbar spine done on February 09, 2020 showed mild reactive endplate changes at  W3-S9 otherwise unremarkable MRI.  Plan: XR Lumbar Spine 2-3 Views.  X-rays showed bilateral L1-L2 narrowing without any osteophytes or syndesmophytes.  Facet joint arthropathy was noted.  Chronic SI joint pain -he gives history of chronic SI joint pain.  He also gives history of bilateral lower extremity radiculopathy.  He had mild tenderness on the palpation of bilateral SI joints.  Plan: XR Pelvis 1-2 Views.  Bilateral SI joint sclerosis was noted.  No significant SI joint narrowing or erosive changes were noted.  Bilateral hip joint x-rays were unremarkable.  HLA B27 (HLA B27 positive)-he states his mother has back pain but she does not have kyphosis.  There is no personal history of diarrhea, constipation, Achilles tendinitis, Planter fasciitis, uveitis or shortness of breath.  X-rays reviewed today showed bilateral SI joint sclerosis.  Thoracic spondylosis and osteophytes with possible syndesmophytes were noted.  The patient's young age, positive HLA-B27, morning stiffness and nocturnal pain gives the possibility of ankylosing spondylitis.  Vitamin D deficiency-he was diagnosed with vitamin D deficiency in March 2022.  He was taking vitamin D supplements.  Last vitamin D level on August 18, 2021 was 80.18.  Other migraine without status migrainosus, not intractable-he takes Maxalt for migraines.  Orders: Orders Placed This Encounter  Procedures   XR Thoracic Spine 2 View   XR Lumbar Spine 2-3 Views   XR Pelvis 1-2 Views   Sedimentation rate   C-reactive protein   No orders of the defined types were placed in this encounter.  .  A handout on back exercises was given.  Follow-Up Instructions: Return for Back pain, HLA-B27 positive.   Bo Merino, MD  Note - This record has been created using Editor, commissioning.  Chart creation errors have been sought, but may not always  have been located. Such creation errors do not reflect on  the standard of medical care.

## 2021-10-01 ENCOUNTER — Encounter: Payer: Self-pay | Admitting: Rheumatology

## 2021-10-01 ENCOUNTER — Ambulatory Visit (INDEPENDENT_AMBULATORY_CARE_PROVIDER_SITE_OTHER): Payer: 59

## 2021-10-01 ENCOUNTER — Ambulatory Visit: Payer: 59 | Attending: Rheumatology | Admitting: Rheumatology

## 2021-10-01 VITALS — BP 114/75 | HR 60 | Resp 15 | Ht 68.0 in | Wt 162.4 lb

## 2021-10-01 DIAGNOSIS — G8929 Other chronic pain: Secondary | ICD-10-CM

## 2021-10-01 DIAGNOSIS — M546 Pain in thoracic spine: Secondary | ICD-10-CM

## 2021-10-01 DIAGNOSIS — M5442 Lumbago with sciatica, left side: Secondary | ICD-10-CM

## 2021-10-01 DIAGNOSIS — M5441 Lumbago with sciatica, right side: Secondary | ICD-10-CM

## 2021-10-01 DIAGNOSIS — M533 Sacrococcygeal disorders, not elsewhere classified: Secondary | ICD-10-CM

## 2021-10-01 DIAGNOSIS — G43809 Other migraine, not intractable, without status migrainosus: Secondary | ICD-10-CM | POA: Diagnosis not present

## 2021-10-01 DIAGNOSIS — Z1589 Genetic susceptibility to other disease: Secondary | ICD-10-CM

## 2021-10-01 DIAGNOSIS — E559 Vitamin D deficiency, unspecified: Secondary | ICD-10-CM

## 2021-10-01 DIAGNOSIS — M5030 Other cervical disc degeneration, unspecified cervical region: Secondary | ICD-10-CM

## 2021-10-01 NOTE — Patient Instructions (Signed)

## 2021-10-02 LAB — C-REACTIVE PROTEIN: CRP: 1.4 mg/L (ref <8.0)

## 2021-10-02 LAB — SEDIMENTATION RATE: Sed Rate: 6 mm/h (ref 0–15)

## 2021-10-03 NOTE — Progress Notes (Signed)
Sed rate and C-reactive protein are normal.  I will discuss results at the follow-up visit.

## 2021-10-23 NOTE — Progress Notes (Signed)
Office Visit Note  Patient: Antonio Santiago             Date of Birth: 05-Mar-1975           MRN: [id number]             PCP: Inda Coke, PA Referring: Inda Coke, PA Visit Date: 11/02/2021 Occupation: @GUAROCC @  Subjective:  Upper and lower back pain  History of Present Illness: Antonio Santiago is a 46 y.o. male with degenerative disc disease and HLA-B27 positive.  He states he continues to have severe thoracic pain.  He also has nocturnal pain.  He has difficulty laying down and getting up from the laying down position.  He continues to have some SI joint pain and morning stiffness.  He states sometimes the SI joint pain radiates down into his thighs.  SI joint pain comes and goes.  None of the other joints are swollen.  He does not like taking meloxicam on a regular basis.  He takes it on as needed basis.  He also has cyclobenzaprine which she takes occasionally.  Activities of Daily Living:  Patient reports morning stiffness for 5-10 minutes.   Patient Reports nocturnal pain.  Difficulty dressing/grooming: Denies Difficulty climbing stairs: Denies Difficulty getting out of chair: Denies Difficulty using hands for taps, buttons, cutlery, and/or writing: Denies  Review of Systems  Constitutional:  Negative for fatigue.  HENT:  Negative for mouth sores and mouth dryness.   Eyes:  Negative for dryness.  Respiratory:  Negative for shortness of breath.   Cardiovascular:  Negative for chest pain and palpitations.  Gastrointestinal:  Negative for blood in stool, constipation and diarrhea.  Endocrine: Negative for increased urination.  Genitourinary:  Negative for involuntary urination.  Musculoskeletal:  Positive for joint pain, joint pain and morning stiffness. Negative for gait problem, joint swelling, myalgias, muscle weakness, muscle tenderness and myalgias.  Skin:  Negative for color change, rash, hair loss and sensitivity to sunlight.  Allergic/Immunologic: Negative for  susceptible to infections.  Neurological:  Negative for dizziness and headaches.  Hematological:  Negative for swollen glands.  Psychiatric/Behavioral:  Positive for sleep disturbance. Negative for depressed mood. The patient is not nervous/anxious.     PMFS History:  Patient Active Problem List   Diagnosis Date Noted   HLA B27 (HLA B27 positive) 04/14/2021   Vitamin D deficiency 05/12/2020   Migraine 06/26/2019   Chronic midline thoracic back pain 06/26/2019   Overweight 06/26/2019    History reviewed. No pertinent past medical history.  Family History  Problem Relation Age of Onset   Esophageal cancer Father    Stomach cancer Father    Healthy Sister    Healthy Sister    Healthy Daughter    History reviewed. No pertinent surgical history. Social History   Social History Narrative   Not on file   Immunization History  Administered Date(s) Administered   Tdap 02/22/2016     Objective: Vital Signs: BP 121/82 (BP Location: Left Arm, Patient Position: Sitting, Cuff Size: Normal)   Pulse 66   Resp 17   Ht 5' 8"  (1.727 m)   Wt 160 lb (72.6 kg)   BMI 24.33 kg/m    Physical Exam Vitals and nursing note reviewed.  Constitutional:      Appearance: He is well-developed.  HENT:     Head: Normocephalic and atraumatic.  Eyes:     Conjunctiva/sclera: Conjunctivae normal.     Pupils: Pupils are equal, round, and reactive to light.  Cardiovascular:     Rate and Rhythm: Normal rate and regular rhythm.     Heart sounds: Normal heart sounds.  Pulmonary:     Effort: Pulmonary effort is normal.     Breath sounds: Normal breath sounds.  Abdominal:     General: Bowel sounds are normal.     Palpations: Abdomen is soft.  Musculoskeletal:     Cervical back: Normal range of motion and neck supple.  Skin:    General: Skin is warm and dry.     Capillary Refill: Capillary refill takes less than 2 seconds.  Neurological:     Mental Status: He is alert and oriented to person,  place, and time.  Psychiatric:        Behavior: Behavior normal.      Musculoskeletal Exam: C-spine was in good range of motion.  He had limited lateral rotation of the thoracic spine with discomfort.  Lumbar spine had limited forward flexion.  He had mild tenderness over SI joints.  Shoulder joints, elbow joints, wrist joints, MCPs PIPs and DIPs and good range of motion.  He had some thickening of bilateral DIP joints.  Hip joints and knee joints were in good range of motion.  There was no tenderness over ankles or MTPs.  There was no planter fasciitis or Achilles tendinitis.  CDAI Exam: CDAI Score: -- Patient Global: --; Provider Global: -- Swollen: --; Tender: -- Joint Exam 11/02/2021   No joint exam has been documented for this visit   There is currently no information documented on the homunculus. Go to the Rheumatology activity and complete the homunculus joint exam.  Investigation: No additional findings.  Imaging: No results found.  Recent Labs: Lab Results  Component Value Date   WBC 5.6 08/18/2021   HGB 14.5 08/18/2021   PLT 224.0 08/18/2021   NA 140 08/18/2021   K 4.1 08/18/2021   CL 103 08/18/2021   CO2 31 08/18/2021   GLUCOSE 87 08/18/2021   BUN 11 08/18/2021   CREATININE 0.83 08/18/2021   BILITOT 0.7 08/18/2021   ALKPHOS 60 08/18/2021   AST 19 08/18/2021   ALT 20 08/18/2021   PROT 7.0 08/18/2021   ALBUMIN 4.3 08/18/2021   CALCIUM 9.2 08/18/2021   October 01, 2021 sed rate 6, CRP 1.4  Speciality Comments: No specialty comments available.  Procedures:  No procedures performed Allergies: Patient has no known allergies.   Assessment / Plan:     Visit Diagnoses: DDD (degenerative disc disease), cervical - History of cervical pain for many years.  MRI of the cervical spine from December 2021 showed multilevel spondylosis and facet joint arthropathy with mild spinal stenosis.  He had some discomfort with lateral rotation of the cervical spine.  DDD  (degenerative disc disease), thoracic - Chronic thoracic pain since he was in his mid 56s.  Inadequate response to NSAIDs.  X-rays showed degenerative changes.  MRI in the past showed degenerative changes.  I reviewed all the x-rays from the last visit with the patient at length.  Predominantly osteophytes were noted on the right side.  No obvious syndesmophytes were noted.  He takes meloxicam and cyclobenzaprine on a as needed basis.  He has been doing stretching exercises which help.  Sed rate and CRP were normal which were also reviewed with the patient.  Need for regular exercise was emphasized.  Arthropathy of lumbar facet joint - L1-L2 narrowing was noted.  Facet joint arthropathy was noted.  X-ray findings were reviewed with the patient.  He has history of chronic back pain for many years.  He has been doing stretching exercises on a daily basis which helped to some extent.  Finger to toe distance was about 10 inches.  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  No SI joint narrowing or erosive changes were noted.  He continues to have morning stiffness and intermittent discomfort in his SI joints which radiates into his gluteal region.  I will obtain MRI of the SI joints to look for sacroiliitis.  I will contact him once the MRI results are available.  HLA B27 (HLA B27 positive)  Vitamin D deficiency-he had vitamin D deficiency in the past.  His vitamin D is normal now.  I advised him to reduce vitamin D 50,000 units only once a month.  Other migraine without status migrainosus, not intractable - He takes Maxalt as needed.  Orders: No orders of the defined types were placed in this encounter.  No orders of the defined types were placed in this encounter.   Face-to-face time spent with patient was 30 minutes. Greater than 50% of time was spent in counseling and coordination of care.  Follow-Up Instructions: Return in about 6 months (around 05/03/2022).   Bo Merino, MD  Note - This record has been created using Editor, commissioning.  Chart creation errors have been sought, but may not always  have been located. Such creation errors do not reflect on  the standard of medical care.

## 2021-10-26 ENCOUNTER — Encounter: Payer: Self-pay | Admitting: Physician Assistant

## 2021-11-02 ENCOUNTER — Ambulatory Visit: Payer: 59 | Attending: Rheumatology | Admitting: Rheumatology

## 2021-11-02 ENCOUNTER — Encounter: Payer: Self-pay | Admitting: Rheumatology

## 2021-11-02 VITALS — BP 121/82 | HR 66 | Resp 17 | Ht 68.0 in | Wt 160.0 lb

## 2021-11-02 DIAGNOSIS — G8929 Other chronic pain: Secondary | ICD-10-CM | POA: Diagnosis not present

## 2021-11-02 DIAGNOSIS — M47816 Spondylosis without myelopathy or radiculopathy, lumbar region: Secondary | ICD-10-CM

## 2021-11-02 DIAGNOSIS — M5030 Other cervical disc degeneration, unspecified cervical region: Secondary | ICD-10-CM | POA: Diagnosis not present

## 2021-11-02 DIAGNOSIS — E559 Vitamin D deficiency, unspecified: Secondary | ICD-10-CM

## 2021-11-02 DIAGNOSIS — M533 Sacrococcygeal disorders, not elsewhere classified: Secondary | ICD-10-CM

## 2021-11-02 DIAGNOSIS — M5134 Other intervertebral disc degeneration, thoracic region: Secondary | ICD-10-CM | POA: Diagnosis not present

## 2021-11-02 DIAGNOSIS — Z1589 Genetic susceptibility to other disease: Secondary | ICD-10-CM

## 2021-11-02 DIAGNOSIS — G43809 Other migraine, not intractable, without status migrainosus: Secondary | ICD-10-CM | POA: Diagnosis not present

## 2021-11-02 NOTE — Addendum Note (Signed)
Addended by: Ellen Henri on: 11/02/2021 01:15 PM   Modules accepted: Orders

## 2021-11-04 ENCOUNTER — Telehealth: Payer: Self-pay | Admitting: Cardiology

## 2021-11-04 NOTE — Telephone Encounter (Signed)
Pt c/o of Chest Pain: STAT if CP now or developed within 24 hours  1. Are you having CP right now? A little bit  2. Are you experiencing any other symptoms (ex. SOB, nausea, vomiting, sweating)?  No  3. How long have you been experiencing CP?  Started 1.5 hour ago  4. Is your CP continuous or coming and going? Coming and going  5. Have you taken Nitroglycerin? No  Wife stated patient is having the chest pain when his blood pressure is higher.  Wife said  HR now is 131.

## 2021-11-04 NOTE — Telephone Encounter (Signed)
Received a call from front desk, transferred stat call to Triage office [facility].   Pt spouse on telephone, Antonio Santiago.  Spouse states Pt has had three episodes of mid chest pain / discomfort with an increased HR of 130's to 160's over the past few days.  Spouse stated this morning his mid chest pain was more present, and his HR was 130 to 137 during the event at 730 am.    Pt  saw Dr Shari Prows back on 07/08/2020 for palpitations.   I advised Ms. Cepele to take patient to the nearest ER for care due to the presence of symptoms: midline chest pain, and tachycardia. I educated her on why further testing was required and why a providers care was recommended this morning.  Spouse understood instructions / stated she would take him to the ER for care.

## 2021-11-05 NOTE — Progress Notes (Unsigned)
Cardiology Office Note:    Date:  11/08/2021   ID:  Antonio BloodAlbano Santiago, DOB 02/06/1976, MRN [id number]  PCP:  Jarold MottoWorley, Samantha, PA   [facility] Providers Cardiologist:  Meriam SpragueHeather E Pemberton, MD     Referring MD: Jarold MottoWorley, Samantha, [place]   Chief Complaint: fast HR/palpitations  History of Present Illness:    Antonio Bloodlbano Bonzo is a very pleasant 46 y.o. male with a hx of chronic back pain, migraines, and palpitations.   He was referred to cardiology by PCP and seen by Dr. Shari ProwsPemberton on 07/08/2020 for palpitations.  He reported 3 to 6230-month history of palpitations lasting 1 to 2 minutes.  Also has central chest discomfort when palpitations occur and it is hard to catch his breath.  Drinks 3-4 coffees per day but does not have worsening palpitations after drinking caffeine.  Admitted to not hydrating well with water. No prior cardiac history.  Cardiac monitor revealed predominant NSR with average HR 74 bpm, 1 run of nonsustained VT lasting 4 beats, 2 runs of SVT with fastest lasting 8 beats at 160 bpm, patient triggered events correlated with SVE's, rare SVE, rare VE, no sustained arrhythmias or significant pauses.  He called our office 11/04/2021 with elevated heart rate 131 bpm, and chest pain that was intermittent.  His wife reported 3 episodes of mid chest pain/discomfort with increased HR 130s to 160s over previous few days. Wife was advised to take patient to ED.  Today, he is here alone for concerns about fast HR and palpitations. Symptoms occur randomly. Noted on one recent occasion, HR was 130-138 bpm. This event occurred around 0600 and woke him up from sleep. Feels tightness in his left chest when HR speeds up. Episodes have occurred on a few occasions over the past 2 weeks, do not occur daily.  A separate episode occurred with HR 170 to 178 bpm. Was unable to capture EKG on his Apple watch. Symptoms have lasted up to 2 hours. When he had symptoms June 2022, symptoms were lasting 15-30 minutes.  Exercise and yoga does not worsen symptoms with exertion. Drinks 1-2 espressos daily. Symptoms do not worsen after caffeine consumption. He denies shortness of breath, lower extremity edema, fatigue, melena, hematuria, hemoptysis, diaphoresis, weakness, presyncope, syncope, orthopnea, and PND.  Past Medical History:  Diagnosis Date   Migraines     History reviewed. No pertinent surgical history.  Current Medications: Current Meds  Medication Sig   Cholecalciferol (VITAMIN D3) 1.25 MG (50000 UT) CAPS Take 1 capsule by mouth once a week.   cyclobenzaprine (FLEXERIL) 10 MG tablet Take 1 tablet (10 mg total) by mouth at bedtime as needed for muscle spasms.   meloxicam (MOBIC) 15 MG tablet Take 1 tablet (15 mg total) by mouth daily. (Patient taking differently: Take 15 mg by mouth as needed.)   rizatriptan (MAXALT) 10 MG tablet Take 1 tablet (10 mg total) by mouth as needed for migraine. May repeat in 2 hours if needed     Allergies:   Patient has no known allergies.   Social History   Socioeconomic History   Marital status: Married    Spouse name: Not on file   Number of children: Not on file   Years of education: Not on file   Highest education level: Not on file  Occupational History   Not on file  Tobacco Use   Smoking status: Never    Passive exposure: Never   Smokeless tobacco: Never  Vaping Use   Vaping Use: Never  used  Substance and Sexual Activity   Alcohol use: Not Currently   Drug use: Never   Sexual activity: Not on file  Other Topics Concern   Not on file  Social History Narrative   Not on file   Social Determinants of Health   Financial Resource Strain: Not on file  Food Insecurity: Not on file  Transportation Needs: Not on file  Physical Activity: Not on file  Stress: Not on file  Social Connections: Not on file     Family History: The patient's family history includes Esophageal cancer in his father; Healthy in his daughter, sister, and sister;  Stomach cancer in his father.  ROS:   Please see the history of present illness.    + palpitations with associated chest tightness  All other systems reviewed and are negative.  Labs/Other Studies Reviewed:    The following studies were reviewed today:  Cardiac monitor 08/17/20  Patch wear time was 13 days and 12 hours Predominant rhythm was NSR with average HR 74bpm, There was 1 run of nonsustained VT lasting 4 beats at a max rate of 207bpm There were 2 runs of SVT with the fastest lasting 8 beats at max rate of 160bpm Patient triggered events correlated with SVEs. Rare SVE (<1%), rare VE (<1%) No sustained arrhythmias or significant pauses    Recent Labs: 08/18/2021: ALT 20; BUN 11; Creatinine, Ser 0.83; Hemoglobin 14.5; Platelets 224.0; Potassium 4.1; Sodium 140  Recent Lipid Panel    Component Value Date/Time   CHOL 174 08/18/2021 1109   TRIG 44.0 08/18/2021 1109   HDL 50.10 08/18/2021 1109   CHOLHDL 3 08/18/2021 1109   VLDL 8.8 08/18/2021 1109   LDLCALC 116 (H) 08/18/2021 1109   LDLDIRECT 109.0 04/20/2020 1546     Risk Assessment/Calculations:       Physical Exam:    VS:  BP 128/68   Pulse 61   Ht 5\' 8"  (1.727 m)   Wt 161 lb (73 kg)   SpO2 99%   BMI 24.48 kg/m     Wt Readings from Last 3 Encounters:  11/08/21 161 lb (73 kg)  11/02/21 160 lb (72.6 kg)  10/01/21 162 lb 6.4 oz (73.7 kg)     GEN:  Well nourished, well developed in no acute distress HEENT: Normal NECK: No JVD; No carotid bruits CARDIAC: RRR, no murmurs, rubs, gallops RESPIRATORY:  Clear to auscultation without rales, wheezing or rhonchi  ABDOMEN: Soft, non-tender, non-distended MUSCULOSKELETAL:  No edema; No deformity. 2+ pedal pulses, equal bilaterally SKIN: Warm and dry NEUROLOGIC:  Alert and oriented x 3 PSYCHIATRIC:  Normal affect   EKG:  EKG is ordered today.  The ekg ordered today demonstrates NSR at 68 bpm, no ST abnormality   Diagnoses:    1. Palpitations   2. Tachycardia     Assessment and Plan:     Palpitations: Recent symptoms of feeling HR speed up, palpitations. Prior cardiac monitor 07/2020 revealed 1 run of nonsustained VT lasting 4 beats, 2 runs of SVT with fastest 8 beats with max HR 160 bpm. No sustained arrhythmias. We will repeat 30 day cardiac monitor. No evidence of electrolyte abnormality on cmet 08/18/21. Will check TSH and magnesium level today for abnormality that may be contributing.   Tachycardia: Recent episodes of recorded HR 130s and 160s lasting several minutes with left-side chest tightness. Denies presyncope, syncope. Clinically, this sounds like SVT. Repeating cardiac monitor. Checking TSH, mag.  Encouraged him to continue to monitor and send  monitor tracings to our office.    Disposition: Keep your October appointment with Dr. Shari Prows   Medication Adjustments/Labs and Tests Ordered: Current medicines are reviewed at length with the patient today.  Concerns regarding medicines are outlined above.  Orders Placed This Encounter  Procedures   TSH   Magnesium   Cardiac event monitor   EKG 12-Lead   ECHOCARDIOGRAM COMPLETE   No orders of the defined types were placed in this encounter.   Patient Instructions  Medication Instructions:   Your physician recommends that you continue on your current medications as directed. Please refer to the Current Medication list given to you today.   *If you need a refill on your cardiac medications before your next appointment, please call your pharmacy*   Lab Work:  TODAY!!!! TSH/MAG  If you have labs (blood work) drawn today and your tests are completely normal, you will receive your results only by: MyChart Message (if you have MyChart) OR A paper copy in the mail If you have any lab test that is abnormal or we need to change your treatment, we will call you to review the results.   Testing/Procedures:  Your physician has requested that you have an echocardiogram. Echocardiography  is a painless test that uses sound waves to create images of your heart. It provides your doctor with information about the size and shape of your heart and how well your heart's chambers and valves are working. This procedure takes approximately one hour. There are no restrictions for this procedure.  [employer] Cardiac Event Monitor Instructions Your physician has requested you wear your cardiac event monitor for ___30__ days, (1-30). [employer] may call or text to confirm a shipping address. The monitor will be sent to a land address via UPS. [employer] will not ship a monitor to a PO BOX. It typically takes 3-5 days to receive your monitor after it has been enrolled. [employer] will assist with USPS tracking if your package is delayed. The telephone number for [employer] is [phone]. Once you have received your monitor, please review the enclosed instructions. Instruction tutorials can also be viewed under help and settings on the enclosed cell phone. Your monitor has already been registered assigning a specific monitor serial # to you.  Applying the monitor Remove cell phone from case and turn it on. The cell phone works as IT consultant and needs to be within [employer] of you at all times. The cell phone will need to be charged on a daily basis. We recommend you plug the cell phone into the enclosed charger at your bedside table every night.  Monitor batteries: You will receive two monitor batteries labelled #1 and #2. These are your recorders. Plug battery #2 onto the second connection on the enclosed charger. Keep one battery on the charger at all times. This will keep the monitor battery deactivated. It will also keep it fully charged for when you need to switch your monitor batteries. A small light will be blinking on the battery emblem when it is charging. The light on the battery emblem will remain on when the battery is fully charged.  Open package of a Monitor strip.  Insert battery #1 into black hood on strip and gently squeeze monitor battery onto connection as indicated in instruction booklet. Set aside while preparing skin.  Choose location for your strip, vertical or horizontal, as indicated in the instruction booklet. Shave to remove all hair from location. There cannot be any lotions, oils, powders, or colognes on  skin where monitor is to be applied. Wipe skin clean with enclosed Saline wipe. Dry skin completely.  Peel paper labeled #1 off the back of the Monitor strip exposing the adhesive. Place the monitor on the chest in the vertical or horizontal position shown in the instruction booklet. One arrow on the monitor strip must be pointing upward. Carefully remove paper labeled #2, attaching remainder of strip to your skin. Try not to create any folds or wrinkles in the strip as you apply it.  Firmly press and release the circle in the center of the monitor battery. You will hear a small beep. This is turning the monitor battery on. The heart emblem on the monitor battery will light up every 5 seconds if the monitor battery in turned on and connected to the patient securely. Do not push and hold the circle down as this turns the monitor battery off. The cell phone will locate the monitor battery. A screen will appear on the cell phone checking the connection of your monitor strip. This may read poor connection initially but change to good connection within the next minute. Once your monitor accepts the connection you will hear a series of 3 beeps followed by a climbing crescendo of beeps. A screen will appear on the cell phone showing the two monitor strip placement options. Touch the picture that demonstrates where you applied the monitor strip.  Your monitor strip and battery are waterproof. You are able to shower, bathe, or swim with the monitor on. They just ask you do not submerge deeper than 3 feet underwater. We recommend removing the monitor  if you are swimming in a lake, river, or ocean.  Your monitor battery will need to be switched to a fully charged monitor battery approximately once a week. The cell phone will alert you of an action which needs to be made.  On the cell phone, tap for details to reveal connection status, monitor battery status, and cell phone battery status. The green dots indicates your monitor is in good status. A red dot indicates there is something that needs your attention.  To record a symptom, click the circle on the monitor battery. In 30-60 seconds a list of symptoms will appear on the cell phone. Select your symptom and tap save. Your monitor will record a sustained or significant arrhythmia regardless of you clicking the button. Some patients do not feel the heart rhythm irregularities. [employer] will notify us of any serious or critical events.  Refer to instruction booklet for instructions on switching batteries, changing strips, the Do not disturb or Pause features, or any additional questions.  Call [employer] at [phone], to confirm your monitor is transmitting and record your baseline. They will answer any questions you may have regarding the monitor instructions at that time.  Returning the monitor to [employer] Place all equipment back into blue box. Peel off strip of paper to expose adhesive and close box securely. There is a prepaid UPS shipping label on this box. Drop in a UPS drop box, or at a UPS facility like [employer]. You may also contact [employer] to arrange UPS to pick up monitor package at your home.    Follow-Up: At [facility], you and your health needs are our priority.  As part of our continuing mission to provide you with exceptional heart care, we have created designated Provider Care Teams.  These Care Teams include your primary Cardiologist (physician) and Advanced Practice Providers (APPs -  Physician Assistants and Nurse  Practitioners) who all work  together to provide you with the care you need, when you need it.  We recommend signing up for the patient portal called "MyChart".  Sign up information is provided on this After Visit Summary.  MyChart is used to connect with patients for Virtual Visits (Telemedicine).  Patients are able to view lab/test results, encounter notes, upcoming appointments, etc.  Non-urgent messages can be sent to your provider as well.   To learn more about what you can do with MyChart, go to [email or web address].    Your next appointment:   1 month(s)  The format for your next appointment:   In Person  Provider:   Meriam Sprague, MD       Important Information About Sugar         Signed, Levi Aland, NP  11/08/2021 1:17 PM    [facility]

## 2021-11-08 ENCOUNTER — Other Ambulatory Visit: Payer: Self-pay | Admitting: Nurse Practitioner

## 2021-11-08 ENCOUNTER — Encounter: Payer: Self-pay | Admitting: Nurse Practitioner

## 2021-11-08 ENCOUNTER — Ambulatory Visit: Payer: 59 | Attending: Nurse Practitioner | Admitting: Nurse Practitioner

## 2021-11-08 ENCOUNTER — Encounter: Payer: Self-pay | Admitting: *Deleted

## 2021-11-08 VITALS — BP 128/68 | HR 61 | Ht 68.0 in | Wt 161.0 lb

## 2021-11-08 DIAGNOSIS — R002 Palpitations: Secondary | ICD-10-CM

## 2021-11-08 DIAGNOSIS — R000 Tachycardia, unspecified: Secondary | ICD-10-CM

## 2021-11-08 NOTE — Patient Instructions (Signed)
Medication Instructions:   Your physician recommends that you continue on your current medications as directed. Please refer to the Current Medication list given to you today.   *If you need a refill on your cardiac medications before your next appointment, please call your pharmacy*   Lab Work:  TODAY!!!! TSH/MAG  If you have labs (blood work) drawn today and your tests are completely normal, you will receive your results only by: Drew (if you have MyChart) OR A paper copy in the mail If you have any lab test that is abnormal or we need to change your treatment, we will call you to review the results.   Testing/Procedures:  Your physician has requested that you have an echocardiogram. Echocardiography is a painless test that uses sound waves to create images of your heart. It provides your doctor with information about the size and shape of your heart and how well your heart's chambers and valves are working. This procedure takes approximately one hour. There are no restrictions for this procedure.  [employer] Cardiac Event Monitor Instructions Your physician has requested you wear your cardiac event monitor for ___30__ days, (1-30). [employer] may call or text to confirm a shipping address. The monitor will be sent to a land address via UPS. [employer] will not ship a monitor to a PO BOX. It typically takes 3-5 days to receive your monitor after it has been enrolled. [employer] will assist with USPS tracking if your package is delayed. The telephone number for [employer] is [phone]. Once you have received your monitor, please review the enclosed instructions. Instruction tutorials can also be viewed under help and settings on the enclosed cell phone. Your monitor has already been registered assigning a specific monitor serial # to you.  Applying the monitor Remove cell phone from case and turn it on. The cell phone works as Dealer and needs to be  within [employer] of you at all times. The cell phone will need to be charged on a daily basis. We recommend you plug the cell phone into the enclosed charger at your bedside table every night.  Monitor batteries: You will receive two monitor batteries labelled #1 and #2. These are your recorders. Plug battery #2 onto the second connection on the enclosed charger. Keep one battery on the charger at all times. This will keep the monitor battery deactivated. It will also keep it fully charged for when you need to switch your monitor batteries. A small light will be blinking on the battery emblem when it is charging. The light on the battery emblem will remain on when the battery is fully charged.  Open package of a Monitor strip. Insert battery #1 into black hood on strip and gently squeeze monitor battery onto connection as indicated in instruction booklet. Set aside while preparing skin.  Choose location for your strip, vertical or horizontal, as indicated in the instruction booklet. Shave to remove all hair from location. There cannot be any lotions, oils, powders, or colognes on skin where monitor is to be applied. Wipe skin clean with enclosed Saline wipe. Dry skin completely.  Peel paper labeled #1 off the back of the Monitor strip exposing the adhesive. Place the monitor on the chest in the vertical or horizontal position shown in the instruction booklet. One arrow on the monitor strip must be pointing upward. Carefully remove paper labeled #2, attaching remainder of strip to your skin. Try not to create any folds or wrinkles in the strip as you apply  it.  Firmly press and release the circle in the center of the monitor battery. You will hear a small beep. This is turning the monitor battery on. The heart emblem on the monitor battery will light up every 5 seconds if the monitor battery in turned on and connected to the patient securely. Do not push and hold the circle down as this turns  the monitor battery off. The cell phone will locate the monitor battery. A screen will appear on the cell phone checking the connection of your monitor strip. This may read poor connection initially but change to good connection within the next minute. Once your monitor accepts the connection you will hear a series of 3 beeps followed by a climbing crescendo of beeps. A screen will appear on the cell phone showing the two monitor strip placement options. Touch the picture that demonstrates where you applied the monitor strip.  Your monitor strip and battery are waterproof. You are able to shower, bathe, or swim with the monitor on. They just ask you do not submerge deeper than 3 feet underwater. We recommend removing the monitor if you are swimming in a lake, river, or ocean.  Your monitor battery will need to be switched to a fully charged monitor battery approximately once a week. The cell phone will alert you of an action which needs to be made.  On the cell phone, tap for details to reveal connection status, monitor battery status, and cell phone battery status. The green dots indicates your monitor is in good status. A red dot indicates there is something that needs your attention.  To record a symptom, click the circle on the monitor battery. In 30-60 seconds a list of symptoms will appear on the cell phone. Select your symptom and tap save. Your monitor will record a sustained or significant arrhythmia regardless of you clicking the button. Some patients do not feel the heart rhythm irregularities. [employer] will notify us of any serious or critical events.  Refer to instruction booklet for instructions on switching batteries, changing strips, the Do not disturb or Pause features, or any additional questions.  Call [employer] at [phone], to confirm your monitor is transmitting and record your baseline. They will answer any questions you may have regarding the monitor  instructions at that time.  Returning the monitor to [employer] Place all equipment back into blue box. Peel off strip of paper to expose adhesive and close box securely. There is a prepaid UPS shipping label on this box. Drop in a UPS drop box, or at a UPS facility like [employer]. You may also contact [employer] to arrange UPS to pick up monitor package at your home.    Follow-Up: At [facility], you and your health needs are our priority.  As part of our continuing mission to provide you with exceptional heart care, we have created designated Provider Care Teams.  These Care Teams include your primary Cardiologist (physician) and Advanced Practice Providers (APPs -  Physician Assistants and Nurse Practitioners) who all work together to provide you with the care you need, when you need it.  We recommend signing up for the patient portal called "MyChart".  Sign up information is provided on this After Visit Summary.  MyChart is used to connect with patients for Virtual Visits (Telemedicine).  Patients are able to view lab/test results, encounter notes, upcoming appointments, etc.  Non-urgent messages can be sent to your provider as well.   To learn more about what you can  do with MyChart, go to [email or web address].    Your next appointment:   1 month(s)  The format for your next appointment:   In Person  Provider:   Meriam Sprague, MD       Important Information About Sugar

## 2021-11-08 NOTE — Progress Notes (Signed)
Patient ID: Antonio Santiago, male   DOB: 1975/03/12, 45 y.o.   MRN: [id number] Patient enrolled for [employer] to ship a 30 day cardiac event monitor to his address on file.  Dr. Johney Frame to read.

## 2021-11-09 ENCOUNTER — Ambulatory Visit (HOSPITAL_COMMUNITY): Payer: 59 | Attending: Cardiovascular Disease

## 2021-11-09 DIAGNOSIS — R002 Palpitations: Secondary | ICD-10-CM | POA: Insufficient documentation

## 2021-11-09 LAB — TSH: TSH: 0.609 u[IU]/mL (ref 0.450–4.500)

## 2021-11-09 LAB — ECHOCARDIOGRAM COMPLETE
Area-P 1/2: 2.92 cm2
S' Lateral: 3.7 cm

## 2021-11-09 LAB — MAGNESIUM: Magnesium: 2.5 mg/dL — ABNORMAL HIGH (ref 1.6–2.3)

## 2021-11-15 ENCOUNTER — Encounter: Payer: Self-pay | Admitting: Internal Medicine

## 2021-11-17 ENCOUNTER — Telehealth: Payer: Self-pay | Admitting: *Deleted

## 2021-11-17 ENCOUNTER — Telehealth: Payer: Self-pay | Admitting: Cardiology

## 2021-11-17 NOTE — Telephone Encounter (Signed)
When prepping the pts chart for his PV appointment for colonoscopy, found that pt is currently being evaluated by a cardiologist for palpitations and runs of SVT and VT.  Pt has had Echo and has recently worn a monitor but does not f/u with cardiologist until 10/31.  Spoke with patient and explained this to him.  Advised patient I would put a recall in for pt to be called to reschedule colonoscopy in November.  Pt verbalized understanding.

## 2021-11-17 NOTE — Telephone Encounter (Signed)
Called the pts wife Orma Render (on [place]) back.  She is calling to inform our office that the pts MR of the Sacrum is being denied by insurance and will need to MD to call the insurance company on the phone, so that this can be approved.   Informed the pts wife that Dr. Estanislado Pandy ordered the MR of the sacrum on the pt, and she would need to reach out to their office, for further assistance with this.  Provided the pts wife Dr. Estanislado Pandy with [facility] office number to call.   Apologies provided by the wife and she will call the right office now.  She was very appreciative for all the assistance provided.

## 2021-11-17 NOTE — Telephone Encounter (Signed)
Pt would ike a callback regarding a letter that he received from [facility]. Please advise

## 2021-12-01 ENCOUNTER — Ambulatory Visit (HOSPITAL_COMMUNITY): Payer: 59

## 2021-12-01 ENCOUNTER — Encounter (HOSPITAL_COMMUNITY): Payer: Self-pay

## 2021-12-14 ENCOUNTER — Ambulatory Visit: Payer: 59 | Admitting: Cardiology

## 2021-12-17 NOTE — Progress Notes (Unsigned)
Cardiology Office Note:    Date:  12/17/2021   ID:  Antonio Santiago, DOB September 23, 1975, MRN [id number]  PCP:  Inda Coke, [facility] Providers Cardiologist:  Freada Bergeron, MD {  Referring MD: Inda Coke, PA     History of Present Illness:    Antonio Santiago is a 46 y.o. male with a hx of chronic back pain, migraines and palpitations who presents to clinic for follow-up.  Patient initially referred in 06/2020 for palpitations. Cardiac monitor revealed predominant NSR with average HR 74 bpm, 1 run of nonsustained VT lasting 4 beats, 2 runs of SVT with fastest lasting 8 beats at 160 bpm, rare SVE, rare VE, no sustained arrhythmias or significant pauses. Patient triggered events correlated with SVE's. We recommended continued surveillance.  Called the office on 11/04/21 with HR running up to 130-160s that sustained for up to 2 hours. Repeat cardiac monitor was recommended but has not been performed. TTE 10/2021 showed LVEF 69-48%, normal diastolic function, normal RV, no significant valve disease.   Today, ***  Past Medical History:  Diagnosis Date   Migraines     No past surgical history on file.  Current Medications: No outpatient medications have been marked as taking for the 12/21/21 encounter (Appointment) with Freada Bergeron, MD.     Allergies:   Patient has no known allergies.   Social History   Socioeconomic History   Marital status: Married    Spouse name: Not on file   Number of children: Not on file   Years of education: Not on file   Highest education level: Not on file  Occupational History   Not on file  Tobacco Use   Smoking status: Never    Passive exposure: Never   Smokeless tobacco: Never  Vaping Use   Vaping Use: Never used  Substance and Sexual Activity   Alcohol use: Not Currently   Drug use: Never   Sexual activity: Not on file  Other Topics Concern   Not on file  Social History Narrative   Not on file    Social Determinants of Health   Financial Resource Strain: Not on file  Food Insecurity: Not on file  Transportation Needs: Not on file  Physical Activity: Not on file  Stress: Not on file  Social Connections: Not on file     Family History: The patient's family history includes Esophageal cancer in his father; Healthy in his daughter, sister, and sister; Stomach cancer in his father.  ROS:   Review of Systems  Constitutional:  Negative for chills, fever and malaise/fatigue.  HENT:  Negative for ear pain.   Respiratory:  Negative for cough.   Cardiovascular:  Positive for chest pain and palpitations. Negative for orthopnea, leg swelling and PND.  Gastrointestinal:  Negative for abdominal pain, blood in stool, diarrhea, heartburn, nausea and vomiting.  Genitourinary:  Negative for dysuria and hematuria.  Musculoskeletal:  Negative for back pain.  Neurological:  Negative for dizziness.     EKGs/Labs/Other Studies Reviewed:    EKG:   07/08/20- normal sinus rhythm, rate 68   TTE 11/09/21: IMPRESSIONS     1. Left ventricular ejection fraction, by estimation, is 55 to 60%. The  left ventricle has normal function. The left ventricle has no regional  wall motion abnormalities. Left ventricular diastolic parameters were  normal.   2. Right ventricular systolic function is normal. The right ventricular  size is normal. There is normal pulmonary artery systolic pressure. The  estimated right ventricular systolic pressure is 30.1 mmHg.   3. The mitral valve is grossly normal. Trivial mitral valve  regurgitation. No evidence of mitral stenosis.   4. The aortic valve is tricuspid. Aortic valve regurgitation is trivial.  No aortic stenosis is present.   5. The inferior vena cava is normal in size with <50% respiratory  variability, suggesting right atrial pressure of 8 mmHg.   Conclusion(s)/Recommendation(s): Normal biventricular function without  evidence of hemodynamically  significant valvular heart disease.   Cardiac Monitor 07/2020: Patch wear time was 13 days and 12 hours Predominant rhythm was NSR with average HR 74bpm, There was 1 run of nonsustained VT lasting 4 beats at a max rate of 207bpm There were 2 runs of SVT with the fastest lasting 8 beats at max rate of 160bpm Patient triggered events correlated with SVEs. Rare SVE (<1%), rare VE (<1%) No sustained arrhythmias or significant pauses     Patch Wear Time:  13 days and 12 hours (2022-05-26T16:39:17-0400 to 2022-06-09T05:31:57-0400)   Patient had a min HR of 47 bpm, max HR of 207 bpm, and avg HR of 74 bpm. Predominant underlying rhythm was Sinus Rhythm. 1 run of Ventricular Tachycardia occurred lasting 4 beats with a max rate of 207 bpm (avg 195 bpm). 2 Supraventricular Tachycardia  runs occurred, the run with the fastest interval lasting 8 beats with a max rate of 160 bpm, the longest lasting 5 beats with an avg rate of 101 bpm. Isolated SVEs were rare (<1.0%), SVE Couplets were rare (<1.0%), and SVE Triplets were rare (<1.0%).  Isolated VEs were rare (<1.0%), VE Couplets were rare (<1.0%), and no VE Triplets were present. Ventricular Trigeminy was present.    Laurance Flatten, MD  Recent Labs: 08/18/2021: ALT 20; BUN 11; Creatinine, Ser 0.83; Hemoglobin 14.5; Platelets 224.0; Potassium 4.1; Sodium 140 11/08/2021: Magnesium 2.5; TSH 0.609  Recent Lipid Panel    Component Value Date/Time   CHOL 174 08/18/2021 1109   TRIG 44.0 08/18/2021 1109   HDL 50.10 08/18/2021 1109   CHOLHDL 3 08/18/2021 1109   VLDL 8.8 08/18/2021 1109   LDLCALC 116 (H) 08/18/2021 1109   LDLDIRECT 109.0 04/20/2020 1546     Physical Exam:    VS:  There were no vitals taken for this visit.    Wt Readings from Last 3 Encounters:  11/08/21 161 lb (73 kg)  11/02/21 160 lb (72.6 kg)  10/01/21 162 lb 6.4 oz (73.7 kg)     GEN: Well nourished, well developed in no acute distress HEENT: Normal NECK: No JVD; No  carotid bruits CARDIAC: RRR, no murmurs, rubs, gallops RESPIRATORY:  Clear to auscultation without rales, wheezing or rhonchi  ABDOMEN: Soft, non-tender, non-distended MUSCULOSKELETAL:  No edema; No deformity  SKIN: Warm and dry NEUROLOGIC:  Alert and oriented x 3 PSYCHIATRIC:  Normal affect   ASSESSMENT:    No diagnosis found.  PLAN:    In order of problems listed above:  #Palpitations: Has had multiple episodes of palpitations over the past year. Initial cardiac monitor 07/2020 with NSR, 2 runs of nonsustained SVT, 1 run NSVT lasting 4 beats, rare SVE and rare VE. Patient triggered events correlated with SVE. He had recurrent palpitations with HR reportedly 130-160s in 10/2021. Was recommended for repeat monitor but this was not completed. TTE showed EF 55-60%, no valve disease, normal RV, normal diastolic function. Currently, ***   Medication Adjustments/Labs and Tests Ordered: Current medicines are reviewed at length with the patient today.  Concerns  regarding medicines are outlined above.  No orders of the defined types were placed in this encounter.  No orders of the defined types were placed in this encounter.   There are no Patient Instructions on file for this visit.     I,Alexis Bryant,acting as a Neurosurgeon for Meriam Sprague, MD.,have documented all relevant documentation on the behalf of Meriam Sprague, MD,as directed by  Meriam Sprague, MD while in the presence of Meriam Sprague, MD.  I, Meriam Sprague, MD, have reviewed all documentation for this visit. The documentation on 12/17/21 for the exam, diagnosis, procedures, and orders are all accurate and complete. Signed, Meriam Sprague, MD  12/17/2021 8:43 PM    [facility]

## 2021-12-20 ENCOUNTER — Encounter: Payer: 59 | Admitting: Internal Medicine

## 2021-12-21 ENCOUNTER — Encounter: Payer: Self-pay | Admitting: Cardiology

## 2021-12-21 ENCOUNTER — Ambulatory Visit: Payer: 59 | Attending: Cardiology | Admitting: Cardiology

## 2021-12-21 ENCOUNTER — Ambulatory Visit: Payer: 59 | Attending: Nurse Practitioner

## 2021-12-21 VITALS — BP 128/74 | HR 77 | Ht 68.0 in | Wt 160.0 lb

## 2021-12-21 DIAGNOSIS — R000 Tachycardia, unspecified: Secondary | ICD-10-CM | POA: Diagnosis not present

## 2021-12-21 DIAGNOSIS — R002 Palpitations: Secondary | ICD-10-CM

## 2021-12-21 MED ORDER — METOPROLOL TARTRATE 25 MG PO TABS: 12.5000 mg | ORAL_TABLET | Freq: Two times a day (BID) | ORAL | 2 refills | Status: DC

## 2021-12-21 NOTE — Progress Notes (Unsigned)
[id number] from office inventory applied.  Patient had 2 monitors delivered to his apartment complex from [place] confirmed.  Devices may be at apartment complex office.  Dr. Johney Frame to read.

## 2021-12-21 NOTE — Progress Notes (Signed)
Cardiology Office Note:    Date:  12/21/2021   ID:  Antonio Santiago, DOB 1975-11-15, MRN [id number]  PCP:  Inda Coke, [facility] Providers Cardiologist:  Freada Bergeron, MD {  Referring MD: Inda Coke, PA     History of Present Illness:    Antonio Santiago is a 46 y.o. male with a hx of chronic back pain =migraines and palpitations who presents to clinic for follow-up.  Patient initially referred in 06/2020 for palpitations. Cardiac monitor revealed predominant NSR with average HR 74 bpm, 1 run of nonsustained VT lasting 4 beats, 2 runs of SVT with fastest lasting 8 beats at 160 bpm, rare SVE, rare VE, no sustained arrhythmias or significant pauses. Patient triggered events correlated with SVE's. We recommended continued surveillance.  Called the office on 11/04/21 with HR running up to 130-160s that sustained for up to 2 hours. Repeat cardiac monitor was recommended but has not been performed. TTE 10/2021 showed LVEF 0000000, normal diastolic function, normal RV, no significant valve disease.   Today, the patient presents with his close family friend, he states that he has been doing ok. He has been having palpitations, his apple watch said that his heart rate reached 174 during the episode. His family friend believes that the palpitations have been happening more frequently. It wasn't modulated by exercise and occurred when he was standing. Symptoms can last for several minutes without abating. Has associated chest pain at the time.   He was supposed to get a heart monitor but never received it and is unsure what happened. He checked his mail every single day but was unable to find it.  He thinks that they have the wrong address.  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  He denies any palpitations, chest pain, shortness of breath, or peripheral edema. No lightheadedness, headaches, syncope, orthopnea, or PND. Past Medical History:   Diagnosis Date   Migraines     No past surgical history on file.  Current Medications: Current Meds  Medication Sig   Cholecalciferol (VITAMIN D3) 1.25 MG (50000 UT) CAPS Take 1 capsule by mouth every 30 (thirty) days.   cyclobenzaprine (FLEXERIL) 10 MG tablet Take 1 tablet (10 mg total) by mouth at bedtime as needed for muscle spasms.   meloxicam (MOBIC) 15 MG tablet Take 1 tablet (15 mg total) by mouth daily.   metoprolol tartrate (LOPRESSOR) 25 MG tablet Take 0.5 tablets (12.5 mg total) by mouth 2 (two) times daily. You may take an extra dose as needed for breakthrough palpitations.   rizatriptan (MAXALT) 10 MG tablet Take 1 tablet (10 mg total) by mouth as needed for migraine. May repeat in 2 hours if needed     Allergies:   Patient has no known allergies.   Social History   Socioeconomic History   Marital status: Married    Spouse name: Not on file   Number of children: Not on file   Years of education: Not on file   Highest education level: Not on file  Occupational History   Not on file  Tobacco Use   Smoking status: Never    Passive exposure: Never   Smokeless tobacco: Never  Vaping Use   Vaping Use: Never used  Substance and Sexual Activity   Alcohol use: Not Currently   Drug use: Never   Sexual activity: Not on file  Other Topics Concern   Not on file  Social History Narrative  Not on file   Social Determinants of Health   Financial Resource Strain: Not on file  Food Insecurity: Not on file  Transportation Needs: Not on file  Physical Activity: Not on file  Stress: Not on file  Social Connections: Not on file     Family History: The patient's family history includes Esophageal cancer in his father; Healthy in his daughter, sister, and sister; Stomach cancer in his father.  ROS:   Review of Systems  Constitutional:  Negative for chills, fever and malaise/fatigue.  HENT:  Negative for ear pain.   Respiratory:  Negative for cough.    Cardiovascular:  Positive for chest pain and palpitations. Negative for orthopnea, leg swelling and PND.  Gastrointestinal:  Negative for abdominal pain, blood in stool, diarrhea, heartburn, nausea and vomiting.  Genitourinary:  Negative for dysuria and hematuria.  Musculoskeletal:  Negative for back pain.  Neurological:  Negative for dizziness.     EKGs/Labs/Other Studies Reviewed:    EKG:  The EKG is personally reviewed 12/21/21: EKG is not ordered today. 07/08/20: NSR, rate 68   TTE 11/09/21: IMPRESSIONS     1. Left ventricular ejection fraction, by estimation, is 55 to 60%. The  left ventricle has normal function. The left ventricle has no regional  wall motion abnormalities. Left ventricular diastolic parameters were  normal.   2. Right ventricular systolic function is normal. The right ventricular  size is normal. There is normal pulmonary artery systolic pressure. The  estimated right ventricular systolic pressure is 58.0 mmHg.   3. The mitral valve is grossly normal. Trivial mitral valve  regurgitation. No evidence of mitral stenosis.   4. The aortic valve is tricuspid. Aortic valve regurgitation is trivial.  No aortic stenosis is present.   5. The inferior vena cava is normal in size with <50% respiratory  variability, suggesting right atrial pressure of 8 mmHg.   Conclusion(s)/Recommendation(s): Normal biventricular function without  evidence of hemodynamically significant valvular heart disease.   Cardiac Monitor 07/2020: Patch wear time was 13 days and 12 hours Predominant rhythm was NSR with average HR 74bpm, There was 1 run of nonsustained VT lasting 4 beats at a max rate of 207bpm There were 2 runs of SVT with the fastest lasting 8 beats at max rate of 160bpm Patient triggered events correlated with SVEs. Rare SVE (<1%), rare VE (<1%) No sustained arrhythmias or significant pauses     Patch Wear Time:  13 days and 12 hours (2022-05-26T16:39:17-0400 to  2022-06-09T05:31:57-0400)   Patient had a min HR of 47 bpm, max HR of 207 bpm, and avg HR of 74 bpm. Predominant underlying rhythm was Sinus Rhythm. 1 run of Ventricular Tachycardia occurred lasting 4 beats with a max rate of 207 bpm (avg 195 bpm). 2 Supraventricular Tachycardia  runs occurred, the run with the fastest interval lasting 8 beats with a max rate of 160 bpm, the longest lasting 5 beats with an avg rate of 101 bpm. Isolated SVEs were rare (<1.0%), SVE Couplets were rare (<1.0%), and SVE Triplets were rare (<1.0%).  Isolated VEs were rare (<1.0%), VE Couplets were rare (<1.0%), and no VE Triplets were present. Ventricular Trigeminy was present.    Gwyndolyn Kaufman, MD  Recent Labs: 08/18/2021: ALT 20; BUN 11; Creatinine, Ser 0.83; Hemoglobin 14.5; Platelets 224.0; Potassium 4.1; Sodium 140 11/08/2021: Magnesium 2.5; TSH 0.609  Recent Lipid Panel    Component Value Date/Time   CHOL 174 08/18/2021 1109   TRIG 44.0 08/18/2021 1109   HDL  50.10 08/18/2021 1109   CHOLHDL 3 08/18/2021 1109   VLDL 8.8 08/18/2021 1109   LDLCALC 116 (H) 08/18/2021 1109   LDLDIRECT 109.0 04/20/2020 1546     Physical Exam:    VS:  BP 128/74   Pulse 77   Ht 5\' 8"  (1.727 m)   Wt 160 lb (72.6 kg)   SpO2 99%   BMI 24.33 kg/m     Wt Readings from Last 3 Encounters:  12/21/21 160 lb (72.6 kg)  11/08/21 161 lb (73 kg)  11/02/21 160 lb (72.6 kg)     GEN:  Well nourished, well developed in no acute distress HEENT: Normal NECK: No JVD; No carotid bruits CARDIAC:  RRR, no murmurs, rubs, gallops RESPIRATORY:  Clear to auscultation without rales, wheezing or rhonchi  ABDOMEN: Soft, non-tender, non-distended MUSCULOSKELETAL:  No edema; No deformity  SKIN: Warm and dry NEUROLOGIC:  Alert and oriented x 3 PSYCHIATRIC:  Normal affect   ASSESSMENT:    1. Palpitations    PLAN:    In order of problems listed above:  #Palpitations: Has had multiple episodes of palpitations over the past year.  Initial cardiac monitor 07/2020 with NSR, 2 runs of nonsustained SVT, 1 run NSVT lasting 4 beats, rare SVE and rare VE. Patient triggered events correlated with SVE. He had recurrent palpitations with HR reportedly 130-160s in 10/2021. Was recommended for repeat monitor bu he has not received it. TTE showed EF 55-60%, no valve disease, normal RV, normal diastolic function. Currently, he is continuing to have significant palpitations. Will apply monitor today and start metop as detailed below. -Apply 30d monitor today -Start metop 12.5mg  BID with additional dose as needed for palpitations  Follow Up: 3 months.  Medication Adjustments/Labs and Tests Ordered: Current medicines are reviewed at length with the patient today.  Concerns regarding medicines are outlined above.  No orders of the defined types were placed in this encounter.  Meds ordered this encounter  Medications   metoprolol tartrate (LOPRESSOR) 25 MG tablet    Sig: Take 0.5 tablets (12.5 mg total) by mouth 2 (two) times daily. You may take an extra dose as needed for breakthrough palpitations.    Dispense:  135 tablet    Refill:  2   There are no Patient Instructions on file for this visit.   I,Coren O'Brien,acting as a Education administrator for Freada Bergeron, MD.,have documented all relevant documentation on the behalf of Freada Bergeron, MD,as directed by  Freada Bergeron, MD while in the presence of Freada Bergeron, MD.  I, Freada Bergeron, MD, have reviewed all documentation for this visit. The documentation on 12/21/21 for the exam, diagnosis, procedures, and orders are all accurate and complete.   Signed, Freada Bergeron, MD  12/21/2021 10:33 AM    [facility]

## 2021-12-21 NOTE — Patient Instructions (Signed)
Medication Instructions:   START TAKING METOPROLOL TARTRATE 12.5 MG BY MOUTH TWICE DAILY-YOU MAY TAKE AN ADDITIONAL DOSE AS NEEDED FOR BREAKTHROUGH PALPITATIONS  *If you need a refill on your cardiac medications before your next appointment, please call your pharmacy*   Testing/Procedures:  Your physician has recommended that you wear an event monitor. Event monitors are medical devices that record the heart's electrical activity. Doctors most often [place] these monitors to diagnose arrhythmias. Arrhythmias are problems with the speed or rhythm of the heartbeat. The monitor is a small, portable device. You can wear one while you do your normal daily activities. This is usually used to diagnose what is causing palpitations/syncope (passing out).  YOU WILL HAVE THIS PLACED ON YOU TODAY BY OUR MONITOR DEPARTMENT    Follow-Up:  3 MONTHS WITH AN EXTENDER IN THE OFFICE   Important Information About Sugar

## 2021-12-22 ENCOUNTER — Encounter: Payer: Self-pay | Admitting: Cardiology

## 2021-12-28 ENCOUNTER — Encounter: Payer: Self-pay | Admitting: Internal Medicine

## 2022-01-26 NOTE — Progress Notes (Signed)
Office Visit Note  Patient: Antonio Santiago             Date of Birth: October 29, 1975           MRN: [id number]             PCP: Inda Coke, PA Referring: Inda Coke, PA Visit Date: 02/08/2022 Occupation: _0 @  Subjective:  Pain in entire spine and SI joints.  History of Present Illness: Terryon Pineiro is a 46 y.o. male with HLA-B27 positive ankylosing spondylitis.  He states he took meloxicam for lower back pain and he started having palpitations.  He is uncertain if the palpitations were related to meloxicam or vitamin D.  He stopped meloxicam and vitamin D.  He states he was evaluated by a cardiologist and was given a prescription for metoprolol but he has not taken it.  He is currently not taking any medications as he is concerned about the side effects of most medications.  He continues to have thoracic and lumbar pain and sometimes rib cage pain.  He continues to have pain and discomfort in the SI joints.  He has nocturnal pain.  He has significant morning stiffness.  He has not noticed any joint swelling.  He denies any history of Planter fasciitis, Achilles tendinitis, uveitis.  He denies shortness of breath.  Activities of Daily Living:  Patient reports morning stiffness for a few minutes.   Patient Reports nocturnal pain.  Difficulty dressing/grooming: Denies Difficulty climbing stairs: Denies Difficulty getting out of chair: Denies Difficulty using hands for taps, buttons, cutlery, and/or writing: Denies  Review of Systems  Constitutional:  Negative for fatigue.  HENT:  Negative for mouth sores and mouth dryness.   Eyes:  Negative for dryness.  Respiratory:  Negative for shortness of breath.   Cardiovascular:  Positive for palpitations. Negative for chest pain.  Gastrointestinal:  Negative for blood in stool, constipation and diarrhea.  Endocrine: Negative for increased urination.  Genitourinary:  Negative for involuntary urination.  Musculoskeletal:  Positive  for myalgias and myalgias. Negative for joint pain, gait problem, joint pain, joint swelling, muscle weakness, morning stiffness and muscle tenderness.  Skin:  Negative for color change, rash, hair loss and sensitivity to sunlight.  Allergic/Immunologic: Negative for susceptible to infections.  Neurological:  Negative for dizziness and headaches.  Hematological:  Negative for swollen glands.  Psychiatric/Behavioral:  Negative for depressed mood and sleep disturbance. The patient is not nervous/anxious.     PMFS History:  Patient Active Problem List   Diagnosis Date Noted   HLA B27 (HLA B27 positive) 04/14/2021   Vitamin D deficiency 05/12/2020   Migraine 06/26/2019   Chronic midline thoracic back pain 06/26/2019   Overweight 06/26/2019    Past Medical History:  Diagnosis Date   Migraines     Family History  Problem Relation Age of Onset   Esophageal cancer Father    Stomach cancer Father    Healthy Sister    Healthy Sister    Healthy Daughter    History reviewed. No pertinent surgical history. Social History   Social History Narrative   Not on file   Immunization History  Administered Date(s) Administered   Tdap 02/22/2016     Objective: Vital Signs: BP 116/79 (BP Location: Left Arm, Patient Position: Sitting, Cuff Size: Normal)   Pulse 73   Ht 5' 8.9" (1.75 m)   Wt 165 lb (74.8 kg)   BMI 24.44 kg/m    Physical Exam Vitals and nursing note reviewed.  Constitutional:      Appearance: He is well-developed.  HENT:     Head: Normocephalic and atraumatic.  Eyes:     Conjunctiva/sclera: Conjunctivae normal.     Pupils: Pupils are equal, round, and reactive to light.  Cardiovascular:     Rate and Rhythm: Normal rate and regular rhythm.     Heart sounds: Normal heart sounds.  Pulmonary:     Effort: Pulmonary effort is normal.     Breath sounds: Normal breath sounds.  Abdominal:     General: Bowel sounds are normal.     Palpations: Abdomen is soft.   Musculoskeletal:     Cervical back: Normal range of motion and neck supple.  Skin:    General: Skin is warm and dry.     Capillary Refill: Capillary refill takes less than 2 seconds.  Neurological:     Mental Status: He is alert and oriented to person, place, and time.  Psychiatric:        Behavior: Behavior normal.      Musculoskeletal Exam: Cervical spine was in good range of motion.  He had no point tenderness over thoracic and lumbar spine.  He had tenderness over SI joints.  He had limited mobility in thoracic and lumbar spine.  Schober's was positive.  Shoulder joints, elbow joints, wrist joints, MCPs PIPs and DIPs been good range of motion with no synovitis.  Hip joints and knee joints in good range of motion.  There was no tenderness over ankles or MTPs.  CDAI Exam: CDAI Score: -- Patient Global: --; Provider Global: -- Swollen: --; Tender: -- Joint Exam 02/08/2022   No joint exam has been documented for this visit   There is currently no information documented on the homunculus. Go to the Rheumatology activity and complete the homunculus joint exam.  Investigation: No additional findings.  Imaging: No results found.  Recent Labs: Lab Results  Component Value Date   WBC 5.6 08/18/2021   HGB 14.5 08/18/2021   PLT 224.0 08/18/2021   NA 140 08/18/2021   K 4.1 08/18/2021   CL 103 08/18/2021   CO2 31 08/18/2021   GLUCOSE 87 08/18/2021   BUN 11 08/18/2021   CREATININE 0.83 08/18/2021   BILITOT 0.7 08/18/2021   ALKPHOS 60 08/18/2021   AST 19 08/18/2021   ALT 20 08/18/2021   PROT 7.0 08/18/2021   ALBUMIN 4.3 08/18/2021   CALCIUM 9.2 08/18/2021    Speciality Comments: No specialty comments available.  December 10, 2021 MRI pelvis showed prominent subchondral marrow edema along the sacroiliac joints bilaterally right worse than left, with probable focal erosive change at the anterior superior aspect of the right SI joint.  Findings are consistent with bilateral  sacroiliitis.  Minimal right SI joint effusion.  No SI joint ankylosis.  Read by Dr. Maurine Simmering  Procedures:  No procedures performed Allergies: Patient has no known allergies.   Assessment / Plan:     Visit Diagnoses: Ankylosing spondylitis of multiple sites in spine (HCC)-patient is HLA-B27 positive.  He has significant morning stiffness, nocturnal pain and discomfort in entire spine.  He had limited range of motion of thoracic and lumbar spine.  MRI of the SI joints showed bilateral sacroiliitis and erosive change in the anterior superior aspect of the right SI joint.  MRI findings were discussed with the patient at length.  Detailed counsel regarding ankylosing spondylitis was provided.  Handout was given.  I discussed the option of starting him on immunosuppressive agents.  Different treatment options and their side effects were discussed.  I discussed the option of Taltz at length.  Indications side effects contraindications were discussed at length.  Patient is uncertain if he wants to take any medications at this point until he reviews the medication at length and discussed this further is with the pharmacist.  I did schedule an appointment with the pharmacist to discussed the medication.  I gave him a handout on Taltz.  Patient decides to proceed with Donnetta Hail he will need labs.  Sacroiliitis ([place]) - Bilateral SI joint sclerosis was noted.  No SI joint narrowing or erosive changes were noted.  Patient states that he was given a prescription of meloxicam by his PCP.  He developed palpitations on meloxicam and he discontinued the medication.  He is hesitant to take any NSAIDs.  HLA B27 (HLA B27 positive)  DDD (degenerative disc disease), cervical - History of cervical pain for many years.  MRI of the cervical spine from Dec 2021: multilevel spondylosis and facet joint arthropathy with mild spinal stenosis.  He continues to have some stiffness in his cervical region.  DDD (degenerative disc  disease), thoracic - X-rays showed degenerative changes.  MRI in the past showed degenerative changes.  Arthropathy of lumbar facet joint - L1-L2 narrowing was noted.  Facet joint arthropathy was noted.  He had limited range of motion of the lumbar spine.  Vitamin D deficiency-patient was treated with vitamin D 50,000 units in the past.  He is currently not taking any vitamin D.  Vitamin D level was 80.18 in June 2023.  I advised him to take over-the-counter vitamin D 2000 units daily.  Other migraine without status migrainosus, not intractable  Orders: No orders of the defined types were placed in this encounter.  No orders of the defined types were placed in this encounter.    Follow-Up Instructions: Return in about 2 months (around 04/11/2022) for Ankylosing spondylitis.   Bo Merino, MD  Note - This record has been created using Editor, commissioning.  Chart creation errors have been sought, but may not always  have been located. Such creation errors do not reflect on  the standard of medical care.

## 2022-01-29 ENCOUNTER — Encounter: Payer: Self-pay | Admitting: Cardiology

## 2022-02-08 ENCOUNTER — Ambulatory Visit: Payer: 59 | Attending: Rheumatology | Admitting: Rheumatology

## 2022-02-08 ENCOUNTER — Encounter: Payer: Self-pay | Admitting: Rheumatology

## 2022-02-08 VITALS — BP 116/79 | HR 73 | Ht 68.9 in | Wt 165.0 lb

## 2022-02-08 DIAGNOSIS — M450 Ankylosing spondylitis of multiple sites in spine: Secondary | ICD-10-CM | POA: Diagnosis not present

## 2022-02-08 DIAGNOSIS — M5030 Other cervical disc degeneration, unspecified cervical region: Secondary | ICD-10-CM

## 2022-02-08 DIAGNOSIS — M5134 Other intervertebral disc degeneration, thoracic region: Secondary | ICD-10-CM

## 2022-02-08 DIAGNOSIS — E559 Vitamin D deficiency, unspecified: Secondary | ICD-10-CM | POA: Diagnosis not present

## 2022-02-08 DIAGNOSIS — M461 Sacroiliitis, not elsewhere classified: Secondary | ICD-10-CM

## 2022-02-08 DIAGNOSIS — M47816 Spondylosis without myelopathy or radiculopathy, lumbar region: Secondary | ICD-10-CM | POA: Diagnosis not present

## 2022-02-08 DIAGNOSIS — Z1589 Genetic susceptibility to other disease: Secondary | ICD-10-CM

## 2022-02-08 DIAGNOSIS — G8929 Other chronic pain: Secondary | ICD-10-CM

## 2022-02-08 DIAGNOSIS — G43809 Other migraine, not intractable, without status migrainosus: Secondary | ICD-10-CM | POA: Diagnosis not present

## 2022-02-08 NOTE — Patient Instructions (Signed)
Ankylosing Spondylitis, Adult  Ankylosing spondylitis (AS) is a long-term (chronic) condition that causes inflammation, often in the spine. Over time, the inflammation can make new bone form in the spine. This can result in the spinal joints fusing together (ankylosis) and loss of movement. In some people, inflammation may affect other areas of the body, such as the shoulders, hips, ribs, and small joints in the hands and feet. Sometimes the eyes are affected. Inflammation can also happen in the joints of the back and in the tissues that connect joints to bones (ligaments) or connect muscles to bones (tendons). As the disease gets worse, the joints that connect the spine to the pelvis (sacroiliac or SIjoints) are often affected. The condition can range from mild to severe. You may have more severe AS if you smoke. What are the causes? The exact cause of this condition is not known. It may be caused by abnormal genes that are passed down through families. The most common gene that has been linked to AS produces a protein called HLA-B27. Even if you have genes for AS, they may need to be triggered to become active. Infection may be one trigger. What increases the risk? You are more likely to develop this condition if: You have a family history of AS. You are between the ages of 34 and 3. You are male. You have frequent gastrointestinal infections. What are the signs or symptoms? The most common symptoms are pain and stiffness that get worse with rest and better with movement. Pain may be worse at night, and stiffness may be worse in the morning. Symptoms also depend on where the inflammation occurs: Inflammation of the SI joints causes pain in the lower back and buttocks. You may also feel pain in your hips. Inflammation in the upper spine, neck, and ribs causes pain in those areas. Rib inflammation may cause shortness of breath. Inflammation in the shoulder, fingers, knees, ankles, toes, or heels may  cause pain and stiffness in those areas. Inflammation in the eyes may cause eye pain, redness, or visual problems. Generalized inflammation may cause fever, loss of appetite, and tiredness (fatigue). How is this diagnosed? This condition may be diagnosed based on: Your symptoms and your medical and family history. A physical exam. Tests, such as: X-rays or an MRI to look for joint changes or inflammation. A blood test to check for the protein HLA-B27. You may be referred to a health care provider who specializes in diseases of the bones, muscles, and joints (rheumatologist). How is this treated? There is no cure for this condition, but treatment can reduce symptoms. Treatment options may include: Medicines, such as: NSAIDs, such as ibuprofen. These may be used first to reduce pain and inflammation. Steroid shots into inflamed joints to help reverse inflammation. Disease-modifying antirheumatic drugs (DMARDs). These may reduce symptoms and slow the progression of the disease. Biologic medicines. These may be used for moderate to severe forms of AS and when other treatments are not helping. These medicines are the most effective but may increase the risk for a serious infection. Physical therapy and physical activity to help keep muscles strong and keep joints from getting stiff. Surgery may be needed for severe joint damage, usually in the hip. This may require hip replacement surgery. Follow these instructions at home: Activity Return to your normal activities as told by your health care provider. Ask your health care provider what activities are safe for you. Try to get exercise every day. Exercise is very important when  you have AS. If you have learned physical therapy exercises, practice them as told by your physical therapist. Take care to avoid falls. When appropriate, use a cane or walker. Remove area rugs from your home. General instructions  Take over-the-counter and prescription  medicines only as told by your health care provider. Maintain good posture when standing and sitting. Maintain a healthy weight. Wear shoes that are supportive and well fitting. Do not use any products that contain nicotine or tobacco. These products include cigarettes, chewing tobacco, and vaping devices, such as e-cigarettes. These can make your condition worse. If you need help quitting, ask your health care provider. Keep all follow-up visits. This is important. Where to find more information Learn as much as you can about AS. You can find support and information from the Spondylitis Association of [place]: www.spondylitis.org Contact a health care provider if: You are on a biologic medicine and you have a fever or any other signs of infection. You have side effects from any of your medicines. Your symptoms are not improving with medicine or are getting worse. Get help right away if: You have trouble breathing. You have a sudden change in vision. These symptoms may be an emergency. Get help right away. Call 911. Do not wait to see if the symptoms will go away. Do not drive yourself to the hospital. Summary Ankylosing spondylitis (AS) is a long-term condition that causes inflammation, often in the spine. AS may be caused by abnormal genes that can be passed down through families. The most common symptoms of AS are pain and stiffness that get worse with rest and better with movement. There is no cure for AS, but treatment can control symptoms and slow progression of the disease. NSAIDs, such as ibuprofen, may be the first treatment used. These help reduce pain and inflammation. This information is not intended to replace advice given to you by your health care provider. Make sure you discuss any questions you have with your health care provider. Document Revised: 09/14/2020 Document Reviewed: 09/14/2020 Elsevier Patient Education  [employer] Injection What is this  medication? IXEKIZUMAB (ix e KIZ ue mab) treats autoimmune conditions, such as psoriasis and arthritis. It works by slowing down an overactive immune system. It is a monoclonal antibody. This medicine may be used for other purposes; ask your health care provider or pharmacist if you have questions. COMMON BRAND NAME(S): TALTZ What should I tell my care team before I take this medication? They need to know if you have any of these conditions: Immune system problems Infection, such as viral infection, chickenpox, cold sores, or herpes Recently received or are scheduled to receive a vaccine Tuberculosis, a positive skin test for tuberculosis, or recent close contact with someone who has tuberculosis An unusual or allergic reaction to ixekizumab, other medications, foods, dyes, or preservatives Pregnant or trying to get pregnant Breast-feeding How should I use this medication? This medication is injected under the skin. It is usually given by your care team in a hospital or clinic setting. It may also be given at home. If you get this medication at home, you will be taught how to prepare and give it. Use exactly as directed. Take it as directed on the prescription label at the same time every day. Keep taking it unless your care team tells you to stop. It is important that you put your used needles and syringes in a special sharps container. Do not put them in a trash can. If  you do not have a sharps container, call your pharmacist or care team to get one. A special MedGuide will be given to you by the pharmacist with each prescription and refill. If you are getting this medication in a hospital or clinic, a special MedGuide will be given to you before each treatment. Be sure to read this information carefully each time. Talk to your care team about the use of this medication in children. While it be prescribed for children as young as 6 years for selected conditions, precautions do apply. Overdosage:  If you think you have taken too much of this medicine contact a poison control center or emergency room at once. NOTE: This medicine is only for you. Do not share this medicine with others. What if I miss a dose? If you get this medication at the hospital or clinic: It is important not to miss your dose. Call your care team if you are unable to keep an appointment. If you give yourself the medication at home: If you miss a dose, take it as soon as you can. Then continue your normal schedule. Do not take double or extra doses. Call your care team with questions. What may interact with this medication? Do not take this medication with any of the following: Live virus vaccines This medication may also interact with the following: Inactivated vaccines This list may not describe all possible interactions. Give your health care provider a list of all the medicines, herbs, non-prescription drugs, or dietary supplements you use. Also tell them if you smoke, drink alcohol, or use illegal drugs. Some items may interact with your medicine. What should I watch for while using this medication? Visit your care team for regular checks on your progress. Tell your care team if your symptoms do not start to get better or if they get worse. You will be tested for tuberculosis (TB) before you start this medication. If your care team prescribes any medication for TB, you should start taking the TB medication before starting this medication. Make sure to finish the full course of TB medication. This medication may increase your risk of getting an infection. Call your care team for advice if you get a fever, chills, sore throat, or other symptoms of a cold or flu. Do not treat yourself. Try to avoid being around people who are sick. This medication can decrease the response to a vaccine. If you need to get vaccinated, tell your care team if you have received this medication within the last 6 months. Extra booster doses may  be needed. Talk to your care team to see if a different vaccination schedule is needed. What side effects may I notice from receiving this medication? Side effects that you should report to your care team as soon as possible: Allergic reactions--skin rash, itching, hives, swelling of the face, lips, tongue, or throat Infection--fever, chills, cough, sore throat, wounds that don't heal, pain or trouble when passing urine, general feeling of discomfort or being unwell Sudden or severe stomach pain, bloody diarrhea, fever, nausea, vomiting Side effects that usually do not require medical attention (report to your care team if they continue or are bothersome): Nausea Pain, redness, or irritation at injection site Runny or stuffy nose Sore throat This list may not describe all possible side effects. Call your doctor for medical advice about side effects. You may report side effects to FDA at 1-800-FDA-1088. Where should I keep my medication? Keep out of the reach of children and pets. Store  in the refrigerator. Do not freeze. Do not shake. Keep this medication in the original container. Protect from light. Use the dose within 30 minutes of removing it from the refrigerator. Get rid of any unused medication after the expiration date. To get rid of medications that are no longer needed or have expired: Take the medication to a medication take-back program. Check with your pharmacy or law enforcement to find a location. If you cannot return the medication, ask your pharmacist or care team how to get rid of this medication safely. NOTE: This sheet is a summary. It may not cover all possible information. If you have questions about this medicine, talk to your doctor, pharmacist, or health care provider.  2023 Elsevier/Gold Standard (2021-04-21 00:00:00)

## 2022-02-28 ENCOUNTER — Ambulatory Visit: Payer: 59 | Attending: Rheumatology | Admitting: Pharmacist

## 2022-02-28 DIAGNOSIS — Z7189 Other specified counseling: Secondary | ICD-10-CM

## 2022-02-28 NOTE — Progress Notes (Signed)
Pharmacy Note  Subjective:  Patient presents today to [facility] for follow up office visit.  Patient was seen by the pharmacist for counseling on [place] for HLA-B27 positive ankylosing spondylitis.Marland Kitchen  He has stopping taking meloxicam due to tachycardia that was monitored by his Apple Watch. He never did start metoprolol that was prescribed by cardiologist. He states that he does not want to start a medication to manage the side effects of another medications. He does not feel as if his cardiology condition and reasoning for metoprolol was adequately explained to him.   He was last seen by Dr. Estanislado Pandy on 02/08/2022. Treatment options were discussed in detail.  He is naive to biologics and prescription strength NSAIDs .  History of inflammatory bowel disease: No  Objective:  CBC    Component Value Date/Time   WBC 5.6 08/18/2021 1109   RBC 4.68 08/18/2021 1109   HGB 14.5 08/18/2021 1109   HCT 42.4 08/18/2021 1109   PLT 224.0 08/18/2021 1109   MCV 90.4 08/18/2021 1109   MCHC 34.3 08/18/2021 1109   RDW 12.9 08/18/2021 1109   LYMPHSABS 1.8 08/18/2021 1109   MONOABS 0.4 08/18/2021 1109   EOSABS 0.0 08/18/2021 1109   BASOSABS 0.0 08/18/2021 1109    CMP     Component Value Date/Time   NA 140 08/18/2021 1109   K 4.1 08/18/2021 1109   CL 103 08/18/2021 1109   CO2 31 08/18/2021 1109   GLUCOSE 87 08/18/2021 1109   BUN 11 08/18/2021 1109   CREATININE 0.83 08/18/2021 1109   CALCIUM 9.2 08/18/2021 1109   PROT 7.0 08/18/2021 1109   ALBUMIN 4.3 08/18/2021 1109   AST 19 08/18/2021 1109   ALT 20 08/18/2021 1109   ALKPHOS 60 08/18/2021 1109   BILITOT 0.7 08/18/2021 1109    Baseline Immunosuppressant Therapy Labs        Latest Ref Rng & Units 08/18/2021   11:09 AM  Hepatitis  Hep C Ab NON-REACTIVE NON-REACTIVE     Lab Results  Component Value Date   HIV NON-REACTIVE 06/26/2019          Latest Ref Rng & Units 08/18/2021   11:09 AM  Serum Protein  Electrophoresis  Total Protein 6.0 - 8.3 g/dL 7.0    Assessment/Plan:  We extensively reviewed disease and risk for progression over time when untreated. Revewied that steroid injections are only for minimal acute relief during inflammatory episodes. They do not prevent progression. Reviewed use of NSAIDs are first line. He is not open to trying meloxicam again due to possible tachycardia from medication. He does not take any OTC NSAIDs prn but has been advised that these may be used for inflammatory and or worsening of back pain though these may will likely not be as effective as DMARDs. We reviewed risk for complications and comorbidities if disease does progress.  We reviewed that goal of biologics is to reduce disease progression.  Counseled patient that Donnetta Hail is a IL-17 inhibitor that works to reduce pain and inflammation associated with arthritis.  Counseled patient on purpose, proper use, and adverse effects of Taltz. Reviewed the most common adverse effects of infection (more commonly nasopharyngitis, URTI), inflammatory bowel disease, and allergic reaction. Counseled patient that Donnetta Hail should be held for infection and prior to scheduled surgery.  Reviewed the importance of regular labs while on Tatum. Will monitor CBC and CMP 1 month after starting and every 3 months routinely thereafter. Will monitor TB gold annually. Standing orders placed. Provided patient  with medication education material and answered all questions.  Advised initial injection must be administered in office.  Patient voiced understanding.    Taltz dose will be: For ankylosing spondylitis load of 160 mg then 80 mg every 28 days    He is most concerned about the risk for infections. He is hesitant to start a medication that may place him at risk for other conditions and or require him to start medications.   At this time, patient would like to think more about Taltz as treatment option. It is unlikely that Taltz/Cosentyx  would be approved as patient has not tried/failed 2 NSAIDs and/or TNF inhibitors. He has been advised to at least take OTC NSAIDs like ibuprofen and naproxen prn to manage inflammatory flares  Chesley Mires, PharmD, MPH, BCPS, CPP Clinical Pharmacist (Rheumatology and Pulmonology)

## 2022-03-09 ENCOUNTER — Encounter: Payer: Self-pay | Admitting: Physician Assistant

## 2022-03-10 ENCOUNTER — Other Ambulatory Visit: Payer: Self-pay | Admitting: Physician Assistant

## 2022-03-10 ENCOUNTER — Other Ambulatory Visit: Payer: Self-pay

## 2022-03-10 DIAGNOSIS — G43809 Other migraine, not intractable, without status migrainosus: Secondary | ICD-10-CM

## 2022-03-10 MED ORDER — RIZATRIPTAN BENZOATE 10 MG PO TABS: 10.0000 mg | ORAL_TABLET | ORAL | 2 refills | Status: DC | PRN

## 2022-03-23 ENCOUNTER — Ambulatory Visit: Payer: 59 | Admitting: Physician Assistant

## 2022-03-24 ENCOUNTER — Ambulatory Visit: Payer: 59 | Admitting: Nurse Practitioner

## 2022-03-31 ENCOUNTER — Encounter: Payer: Self-pay | Admitting: Physician Assistant

## 2022-03-31 ENCOUNTER — Ambulatory Visit (INDEPENDENT_AMBULATORY_CARE_PROVIDER_SITE_OTHER): Payer: 59 | Admitting: Physician Assistant

## 2022-03-31 VITALS — BP 100/70 | HR 70 | Temp 97.7°F | Ht 68.5 in | Wt 164.0 lb

## 2022-03-31 DIAGNOSIS — G43809 Other migraine, not intractable, without status migrainosus: Secondary | ICD-10-CM | POA: Diagnosis not present

## 2022-03-31 DIAGNOSIS — M450 Ankylosing spondylitis of multiple sites in spine: Secondary | ICD-10-CM

## 2022-03-31 DIAGNOSIS — R002 Palpitations: Secondary | ICD-10-CM | POA: Diagnosis not present

## 2022-03-31 DIAGNOSIS — Z0000 Encounter for general adult medical examination without abnormal findings: Secondary | ICD-10-CM | POA: Diagnosis not present

## 2022-03-31 DIAGNOSIS — E781 Pure hyperglyceridemia: Secondary | ICD-10-CM | POA: Diagnosis not present

## 2022-03-31 DIAGNOSIS — E559 Vitamin D deficiency, unspecified: Secondary | ICD-10-CM | POA: Diagnosis not present

## 2022-03-31 DIAGNOSIS — Z1211 Encounter for screening for malignant neoplasm of colon: Secondary | ICD-10-CM

## 2022-03-31 LAB — CBC WITH DIFFERENTIAL/PLATELET
Basophils Absolute: 0 10*3/uL (ref 0.0–0.1)
Basophils Relative: 0.3 % (ref 0.0–3.0)
Eosinophils Absolute: 0 10*3/uL (ref 0.0–0.7)
Eosinophils Relative: 0.6 % (ref 0.0–5.0)
HCT: 44.9 % (ref 39.0–52.0)
Hemoglobin: 15.7 g/dL (ref 13.0–17.0)
Lymphocytes Relative: 26.3 % (ref 12.0–46.0)
Lymphs Abs: 1.8 10*3/uL (ref 0.7–4.0)
MCHC: 34.8 g/dL (ref 30.0–36.0)
MCV: 90.7 fl (ref 78.0–100.0)
Monocytes Absolute: 0.6 10*3/uL (ref 0.1–1.0)
Monocytes Relative: 8 % (ref 3.0–12.0)
Neutro Abs: 4.5 10*3/uL (ref 1.4–7.7)
Neutrophils Relative %: 64.8 % (ref 43.0–77.0)
Platelets: 220 10*3/uL (ref 150.0–400.0)
RBC: 4.96 Mil/uL (ref 4.22–5.81)
RDW: 13 % (ref 11.5–15.5)
WBC: 7 10*3/uL (ref 4.0–10.5)

## 2022-03-31 LAB — COMPREHENSIVE METABOLIC PANEL
ALT: 15 U/L (ref 0–53)
AST: 14 U/L (ref 0–37)
Albumin: 4.5 g/dL (ref 3.5–5.2)
Alkaline Phosphatase: 61 U/L (ref 39–117)
BUN: 11 mg/dL (ref 6–23)
CO2: 31 mEq/L (ref 19–32)
Calcium: 9.2 mg/dL (ref 8.4–10.5)
Chloride: 104 mEq/L (ref 96–112)
Creatinine, Ser: 0.83 mg/dL (ref 0.40–1.50)
GFR: 105.57 mL/min (ref >60.00)
Glucose, Bld: 92 mg/dL (ref 70–99)
Potassium: 4.2 mEq/L (ref 3.5–5.1)
Sodium: 143 mEq/L (ref 135–145)
Total Bilirubin: 0.8 mg/dL (ref 0.2–1.2)
Total Protein: 6.9 g/dL (ref 6.0–8.3)

## 2022-03-31 LAB — VITAMIN D 25 HYDROXY (VIT D DEFICIENCY, FRACTURES): VITD: 29.33 ng/mL — ABNORMAL LOW (ref 30.00–100.00)

## 2022-03-31 LAB — LIPID PANEL
Cholesterol: 181 mg/dL (ref 0–200)
HDL: 47.5 mg/dL (ref >39.00)
LDL Cholesterol: 118 mg/dL — ABNORMAL HIGH (ref 0–99)
NonHDL: 133.94
Total CHOL/HDL Ratio: 4
Triglycerides: 79 mg/dL (ref 0.0–149.0)
VLDL: 15.8 mg/dL (ref 0.0–40.0)

## 2022-03-31 MED ORDER — RIZATRIPTAN BENZOATE 10 MG PO TABS: 10.0000 mg | ORAL_TABLET | ORAL | 2 refills | Status: DC | PRN

## 2022-03-31 NOTE — Patient Instructions (Addendum)
It was great to see you!  My understanding is that you can go to any local piercing shop to get a "Daith" piercing - this is the location for migraine prevention. You can wait to discuss this with your neurologist if you prefer.  Please go to the lab for blood work.   Our office will call you with your results unless you have chosen to receive results via MyChart.  If your blood work is normal we will follow-up each year for physicals and as scheduled for chronic medical problems.  If anything is abnormal we will treat accordingly and get you in for a follow-up.  Take care,  Aldona Bar

## 2022-03-31 NOTE — Progress Notes (Signed)
Subjective:    Antonio Santiago is a 47 y.o. male and is here for a comprehensive physical exam.  HPI  Health Maintenance Due  Topic Date Due   COLONOSCOPY (Pts 45-67yrs Insurance coverage will need to be confirmed)  Never done   Acute Concerns: None  Chronic Issues: Migraines He reports no changes. He is compliant with his Rizatriptan 10 mg, which he notes is very helpful.  Back Pain He continues to have chronic back pain.  It at times bothers him while sleeping.  He sees rheum for his AS.  Palpitations He has been following up with a cardiologist about his symptoms.  He continues to have episodes and he is unsure what it could be attributed to.  He does not take metoprolol and does not feel like he needs to at this time, he continues to follow with Dr Johney Frame.  Health Maintenance: Immunizations -- UTD Colonoscopy -- Due - referral placed again today PSA -- NA Diet -- overall balanced Sleep habits -- He sleeps well. He wakes up a few times in the night, but he reports it has been that way all his life. He sometimes has back pain while sleeping.  Exercise -- works in very active job  Weight history: Wt Readings from Last 10 Encounters:  03/31/22 164 lb (74.4 kg)  02/08/22 165 lb (74.8 kg)  12/21/21 160 lb (72.6 kg)  11/08/21 161 lb (73 kg)  11/02/21 160 lb (72.6 kg)  10/01/21 162 lb 6.4 oz (73.7 kg)  08/18/21 159 lb 8 oz (72.3 kg)  05/05/21 163 lb (73.9 kg)  04/14/21 167 lb (75.8 kg)  04/14/21 167 lb 6.1 oz (75.9 kg)   Body mass index is 24.57 kg/m.  Mood -- stable Alcohol use --  reports that he does not currently use alcohol.  Tobacco use --  Tobacco Use: Low Risk  (03/31/2022)   Patient History    Smoking Tobacco Use: Never    Smokeless Tobacco Use: Never    Passive Exposure: Never    Eligible for Low Dose CT? no  UTD with eye doctor? Yes  UTD with dentist? Not in the [place] He gets checked when he visits [place], his cousin is a Pharmacist, community.       03/31/2022    9:01 AM  Depression screen PHQ 2/9  Decreased Interest 0  Down, Depressed, Hopeless 0  PHQ - 2 Score 0    Other providers/specialists: Patient Care Team: Inda Coke, [place] as PCP - General (Physician Assistant) Freada Bergeron, MD as PCP - Cardiology (Cardiology)    PMHx, SurgHx, SocialHx, Medications, and Allergies were reviewed in the Visit Navigator and updated as appropriate.   Past Medical History:  Diagnosis Date   Migraines     History reviewed. No pertinent surgical history.   Family History  Problem Relation Age of Onset   Esophageal cancer Father    Stomach cancer Father    Healthy Sister    Healthy Sister    Healthy Daughter     Social History   Tobacco Use   Smoking status: Never    Passive exposure: Never   Smokeless tobacco: Never  Vaping Use   Vaping Use: Never used  Substance Use Topics   Alcohol use: Not Currently   Drug use: Never    Review of Systems:   Review of Systems  Constitutional:  Negative for chills, fever, malaise/fatigue and weight loss.  HENT:  Negative for hearing loss, sinus pain and sore throat.  Respiratory:  Negative for cough and hemoptysis.   Cardiovascular:  Negative for chest pain, palpitations, leg swelling and PND.  Gastrointestinal:  Negative for abdominal pain, constipation, diarrhea, heartburn, nausea and vomiting.  Genitourinary:  Negative for dysuria, frequency and urgency.  Musculoskeletal:  Negative for back pain, myalgias and neck pain.  Skin:  Negative for itching and rash.  Neurological:  Negative for dizziness, tingling, seizures and headaches.  Endo/Heme/Allergies:  Negative for polydipsia.  Psychiatric/Behavioral:  Negative for depression. The patient is not nervous/anxious.     Objective:    Vitals:   03/31/22 0902  BP: 100/70  Pulse: 70  Temp: 97.7 F (36.5 C)  SpO2: 97%    Body mass index is 24.57 kg/m.  General  Alert, cooperative, no distress, appears stated  age  Head:  Normocephalic, without obvious abnormality, atraumatic  Eyes:  PERRL, conjunctiva/corneas clear, EOM's intact, fundi benign, both eyes       Ears:  Normal TM's and external ear canals, both ears  Nose: Nares normal, septum midline, mucosa normal, no drainage or sinus tenderness  Throat: Lips, mucosa, and tongue normal; teeth and gums normal  Neck: Supple, symmetrical, trachea midline, no adenopathy;     thyroid:  No enlargement/tenderness/nodules; no carotid bruit or JVD  Back:   Symmetric, no curvature, ROM normal, no CVA tenderness  Lungs:   Clear to auscultation bilaterally, respirations unlabored  Chest wall:  No tenderness or deformity  Heart:  Regular rate and rhythm, S1 and S2 normal, no murmur, rub or gallop  Abdomen:   Soft, non-tender, bowel sounds active all four quadrants, no masses, no organomegaly  Extremities: Extremities normal, atraumatic, no cyanosis or edema  Prostate : Deferred   Skin: Skin color, texture, turgor normal, no rashes or lesions  Lymph nodes: Cervical, supraclavicular, and axillary nodes normal  Neurologic: CNII-XII grossly intact. Normal strength, sensation and reflexes throughout   AssessmentPlan:   Routine physical examination Today patient counseled on age appropriate routine health concerns for screening and prevention, each reviewed and up to date or declined. Immunizations reviewed and up to date or declined. Labs ordered and reviewed. Risk factors for depression reviewed and negative. Hearing function and visual acuity are intact. ADLs screened and addressed as needed. Functional ability and level of safety reviewed and appropriate. Education, counseling and referrals performed based on assessed risks today. Patient provided with a copy of personalized plan for preventive services.  Other migraine without status migrainosus, not intractable Overall controlled Continue maxalt 10 mg prn We placed referral to neurology for Botox treatment  per patient request, he has not seen them yet Considering Daith piercing Follow-up in 1 yr, sooner if concerns  Special screening for malignant neoplasms, colon Referral to GI placed  Vitamin D deficiency Update Vit D and make recommendations accordingly  Hypertriglyceridemia Update lipid panel and make recommendations accordingly  Palpitations Manages with cardiology Denies new sx  Ankylosing spondylitis of multiple sites in spine ([place]) Sees rheum and sports medicine regularly Denies new/immediate concerns  I,Rachel Rivera,acting as a scribe for Inda Coke, PA.,have documented all relevant documentation on the behalf of Inda Coke, PA,as directed by  Inda Coke, PA while in the presence of Inda Coke, [place].  I, Inda Coke, [place], have reviewed all documentation for this visit. The documentation on 03/31/22 for the exam, diagnosis, procedures, and orders are all accurate and complete.  Inda Coke, PA-C [place]

## 2022-04-14 ENCOUNTER — Ambulatory Visit: Payer: 59 | Admitting: Physician Assistant

## 2022-05-04 ENCOUNTER — Ambulatory Visit: Payer: 59 | Admitting: Physician Assistant

## 2022-05-04 ENCOUNTER — Ambulatory Visit: Payer: 59 | Admitting: Psychiatry

## 2022-05-06 ENCOUNTER — Encounter: Payer: Self-pay | Admitting: Neurology

## 2022-05-06 ENCOUNTER — Ambulatory Visit: Payer: 59 | Admitting: Neurology

## 2022-05-06 VITALS — BP 118/70 | HR 75 | Ht 72.0 in | Wt 167.6 lb

## 2022-05-06 DIAGNOSIS — G43E09 Chronic migraine with aura, not intractable, without status migrainosus: Secondary | ICD-10-CM | POA: Diagnosis not present

## 2022-05-06 MED ORDER — TOPIRAMATE 50 MG PO TABS: 50.0000 mg | ORAL_TABLET | Freq: Every day | ORAL | 6 refills | Status: DC

## 2022-05-06 NOTE — Patient Instructions (Addendum)
Options for prevention (decrease migraine overall):   Start Topiramate(numbness and tingling fingers and toes), then Nortriptyline(very groggy and tired). After that we may be able to start Botox(every 3 months) or you may need to try Ajovy or Emgality are once a month injections.   OnabotulinumtoxinA Injection (Medical Use) What is this medication? ONABOTULINUMTOXINA (o na BOTT you lye num tox in eh) treats severe muscle spasms. It may also be used to prevent migraine headaches. It can treat excessive sweating when other medications do not work well enough. This medicine may be used for other purposes; ask your health care provider or pharmacist if you have questions. COMMON BRAND NAME(S): Botox What should I tell my care team before I take this medication? They need to know if you have any of these conditions: Breathing problems Cerebral palsy spasms Difficulty urinating Heart problems History of surgery where this medication is going to be used Infection at the site where this medication is going to be used Myasthenia gravis or other neurologic disease Nerve or muscle disease Surgery plans Take medications that treat or prevent blood clots Thyroid problems An unusual or allergic reaction to botulinum toxin, albumin, other medications, foods, dyes, or preservatives Pregnant or trying to get pregnant Breast-feeding How should I use this medication? This medication is for injected into a muscle. It is given by your care team in a hospital or clinic setting. A special MedGuide will be given to you before each treatment. Be sure to read this information carefully each time. Talk to your care team about the use of this medication in children. While this medication may be prescribed for children as young as 2 years for selected conditions, precautions do apply. Overdosage: If you think you have taken too much of this medicine contact a poison control center or emergency room at once. NOTE:  This medicine is only for you. Do not share this medicine with others. What if I miss a dose? This does not apply. What may interact with this medication? Aminoglycoside antibiotics, such as gentamicin, neomycin, tobramycin Muscle relaxants Other botulinum toxin injections This list may not describe all possible interactions. Give your health care provider a list of all the medicines, herbs, non-prescription drugs, or dietary supplements you use. Also tell them if you smoke, drink alcohol, or use illegal drugs. Some items may interact with your medicine. What should I watch for while using this medication? Visit your care team for regular check ups. This medication will cause weakness in the muscle where it is injected. Tell your care team if you feel unusually weak in other muscles. Get medical help right away if you have problems with breathing, swallowing, or talking. This medication might make your eyelids droop or make you see blurry or double. If you have weak muscles or trouble seeing do not drive a car, use machinery, or do other dangerous activities. This medication contains albumin from human blood. It may be possible to pass an infection in this medication, but no cases have been reported. Talk to your care team about the risks and benefits of this medication. If your activities have been limited by your condition, go back to your regular routine slowly after treatment with this medication. What side effects may I notice from receiving this medication? Side effects that you should report to your care team as soon as possible: Allergic reactions--skin rash, itching, hives, swelling of the face, lips, tongue, or throat Dryness or irritation of the eyes, eye pain, change in vision, sensitivity  to light Infection--fever, chills, cough, sore throat, wounds that don't heal, pain or trouble when passing urine, general feeling of discomfort or being unwell Spread of botulinum toxin  effects--unusual weakness or fatigue, blurry or double vision, trouble swallowing, hoarseness or trouble speaking, trouble breathing, loss of bladder control Trouble passing urine Side effects that usually do not require medical attention (report these to your care team if they continue or are bothersome): Dry mouth Eyelid drooping Fatigue Headache Pain, redness, or irritation at injection site This list may not describe all possible side effects. Call your doctor for medical advice about side effects. You may report side effects to FDA at 1-800-FDA-1088. Where should I keep my medication? This medication is given in a hospital or clinic and will not be stored at home. NOTE: This sheet is a summary. It may not cover all possible information. If you have questions about this medicine, talk to your doctor, pharmacist, or health care provider.  2023 Elsevier/Gold Standard (2021-02-04 00:00:00) Rolanda Lundborg Injection What is this medication? FREMANEZUMAB (fre ma NEZ ue mab) prevents migraines. It works by blocking a substance in the body that causes migraines. It is a monoclonal antibody. This medicine may be used for other purposes; ask your health care provider or pharmacist if you have questions. COMMON BRAND NAME(S): AJOVY What should I tell my care team before I take this medication? They need to know if you have any of these conditions: An unusual or allergic reaction to fremanezumab, other medications, foods, dyes, or preservatives Pregnant or trying to get pregnant Breast-feeding How should I use this medication? This medication is injected under the skin. You will be taught how to prepare and give it. Take it as directed on the prescription label. Keep taking it unless your care team tells you to stop. It is important that you put your used needles and syringes in a special sharps container. Do not put them in a trash can. If you do not have a sharps container, call your pharmacist or  care team to get one. Talk to your care team about the use of this medication in children. Special care may be needed. Overdosage: If you think you have taken too much of this medicine contact a poison control center or emergency room at once. NOTE: This medicine is only for you. Do not share this medicine with others. What if I miss a dose? If you miss a dose, take it as soon as you can. If it is almost time for your next dose, take only that dose. Do not take double or extra doses. What may interact with this medication? Interactions are not expected. This list may not describe all possible interactions. Give your health care provider a list of all the medicines, herbs, non-prescription drugs, or dietary supplements you use. Also tell them if you smoke, drink alcohol, or use illegal drugs. Some items may interact with your medicine. What should I watch for while using this medication? Tell your care team if your symptoms do not start to get better or if they get worse. What side effects may I notice from receiving this medication? Side effects that you should report to your care team as soon as possible: Allergic reactions or angioedema--skin rash, itching or hives, swelling of the face, eyes, lips, tongue, arms, or legs, trouble swallowing or breathing Side effects that usually do not require medical attention (report to your care team if they continue or are bothersome): Pain, redness, or irritation at injection site This  list may not describe all possible side effects. Call your doctor for medical advice about side effects. You may report side effects to FDA at 1-800-FDA-1088. Where should I keep my medication? Keep out of the reach of children and pets. Store in a refrigerator or at room temperature between 20 and 25 degrees C (68 and 77 degrees F). Refrigeration (preferred): Store in the refrigerator. Do not freeze. Keep in the original container until you are ready to take it. Remove the  dose from the carton about 30 minutes before it is time for you to use it. If the dose is not used, it may be stored in the original container at room temperature for 7 days. Get rid of any unused medication after the expiration date. Room Temperature: This medication may be stored at room temperature for up to 7 days. Keep it in the original container. Protect from light until time of use. If it is stored at room temperature, get rid of any unused medication after 7 days or after it expires, whichever is first. To get rid of medications that are no longer needed or have expired: Take the medication to a medication take-back program. Check with your pharmacy or law enforcement to find a location. If you cannot return the medication, ask your pharmacist or care team how to get rid of this medication safely. NOTE: This sheet is a summary. It may not cover all possible information. If you have questions about this medicine, talk to your doctor, pharmacist, or health care provider.  2023 Elsevier/Gold Standard (2021-03-30 00:00:00) Topiramate Tablets What is this medication? TOPIRAMATE (toe PYRE a mate) prevents and controls seizures in people with epilepsy. It may also be used to prevent migraine headaches. It works by calming overactive nerves in your body. This medicine may be used for other purposes; ask your health care provider or pharmacist if you have questions. COMMON BRAND NAME(S): Topamax, Topiragen What should I tell my care team before I take this medication? They need to know if you have any of these conditions: Bleeding disorder Kidney disease Lung disease Suicidal thoughts, plans, or attempt by you or a family member An unusual or allergic reaction to topiramate, other medications, foods, dyes, or preservatives Pregnant or trying to get pregnant Breast-feeding How should I use this medication? Take this medication by mouth with water. Take it as directed on the prescription label  at the same time every day. Do not cut, crush or chew this medicine. Swallow the tablets whole. You can take it with or without food. If it upsets your stomach, take it with food. Keep taking it unless your care team tells you to stop. A special MedGuide will be given to you by the pharmacist with each prescription and refill. Be sure to read this information carefully each time. Talk to your care team about the use of this medication in children. While it may be prescribed for children as young as 2 years for selected conditions, precautions do apply. Overdosage: If you think you have taken too much of this medicine contact a poison control center or emergency room at once. NOTE: This medicine is only for you. Do not share this medicine with others. What if I miss a dose? If you miss a dose, take it as soon as you can unless it is within 6 hours of the next dose. If it is within 6 hours of the next dose, skip the missed dose. Take the next dose at the normal time. Do  not take double or extra doses. What may interact with this medication? Acetazolamide Alcohol Antihistamines for allergy, cough, and cold Aspirin and aspirin-like medications Atropine Certain medications for anxiety or sleep Certain medications for bladder problems, such as oxybutynin, tolterodine Certain medications for depression, such as amitriptyline, fluoxetine, sertraline Certain medications for Parkinson disease, such as benztropine, trihexyphenidyl Certain medications for seizures, such as carbamazepine, lamotrigine, phenobarbital, phenytoin, primidone, valproic acid, zonisamide Certain medications for stomach problems, such as dicyclomine, hyoscyamine Certain medications for travel sickness, such as scopolamine Certain medications that treat or prevent blood clots, such as warfarin, enoxaparin, dalteparin, apixaban, dabigatran, rivaroxaban Digoxin Diltiazem Estrogen and progestin hormones General anesthetics, such as  halothane, isoflurane, methoxyflurane, propofol Glyburide Hydrochlorothiazide Ipratropium Lithium Medications that relax muscles Metformin NSAIDs, medications for pain and inflammation, such as ibuprofen or naproxen Opioid medications for pain Phenothiazines, such as chlorpromazine, mesoridazine, prochlorperazine, thioridazine Pioglitazone This list may not describe all possible interactions. Give your health care provider a list of all the medicines, herbs, non-prescription drugs, or dietary supplements you use. Also tell them if you smoke, drink alcohol, or use illegal drugs. Some items may interact with your medicine. What should I watch for while using this medication? Visit your care team for regular checks on your progress. Tell your care team if your symptoms do not start to get better or if they get worse. Do not suddenly stop taking this medication. You may develop a severe reaction. Your care team will tell you how much medication to take. If your care team wants you to stop the medication, the dose may be slowly lowered over time to avoid any side effects. Wear a medical ID bracelet or chain. Carry a card that describes your condition. List the medications and doses you take on the card. This medication may affect your coordination, reaction time, or judgment. Do not drive or operate machinery until you know how this medication affects you. Sit up or stand slowly to reduce the risk of dizzy or fainting spells. Drinking alcohol with this medication can increase the risk of these side effects. This medication may cause serious skin reactions. They can happen weeks to months after starting the medication. Contact your care team right away if you notice fevers or flu-like symptoms with a rash. The rash may be red or purple and then turn into blisters or peeling of the skin. You may also notice a red rash with swelling of the face, lips, or lymph nodes in your neck or under your arms. This  medication may cause thoughts of suicide or depression. This includes sudden changes in mood, behaviors, or thoughts. These changes can happen at any time but are more common in the beginning of treatment or after a change in dose. Call your care team right away if you experience these thoughts or worsening depression. This medication may slow your child's growth if it is taken for a long time at high doses. Your child's care team will monitor your child's growth. Using this medication for a long time may weaken your bones. The risk of bone fractures may be increased. Talk to your care team about your bone health. Discuss this medication with your care team if you may be pregnant. Serious birth defects can occur if you take this medication during pregnancy. There are benefits and risks to taking medications during pregnancy. Your care team can help you find the option that works for you. Contraception is recommended while taking this medication. Estrogen and progestin hormones may not  work as well while you are taking this medication. Your care team can help you find the option that works for you. Talk to your care team before breastfeeding. Changes to your treatment plan may be needed. What side effects may I notice from receiving this medication? Side effects that you should report to your care team as soon as possible: Allergic reactions--skin rash, itching, hives, swelling of the face, lips, tongue, or throat High acid level--trouble breathing, unusual weakness or fatigue, confusion, headache, fast or irregular heartbeat, nausea, vomiting High ammonia level--unusual weakness or fatigue, confusion, loss of appetite, nausea, vomiting, seizures Fever that does not go away, decrease in sweat Kidney stones--blood in the urine, pain or trouble passing urine, pain in the lower back or sides Redness, blistering, peeling or loosening of the skin, including inside the mouth Sudden eye pain or change in vision  such as blurry vision, seeing halos around lights, vision loss Thoughts of suicide or self-harm, worsening mood, feelings of depression Side effects that usually do not require medical attention (report to your care team if they continue or are bothersome): Burning or tingling sensation in hands or feet Difficulty with paying attention, memory, or speech Dizziness Drowsiness Fatigue Loss of appetite with weight loss Slow or sluggish movements of the body This list may not describe all possible side effects. Call your doctor for medical advice about side effects. You may report side effects to FDA at 1-800-FDA-1088. Where should I keep my medication? Keep out of the reach of children and pets. Store between 15 and 30 degrees C (59 and 86 degrees F). Protect from moisture. Keep the container tightly closed. Get rid of any unused medication after the expiration date. To get rid of medications that are no longer needed or have expired: Take the medication to a medication take-back program. Check with your pharmacy or law enforcement to find a location. If you cannot return the medication, check the label or package insert to see if the medication should be thrown out in the garbage or flushed down the toilet. If you are not sure, ask your care team. If it is safe to put it in the trash, empty the medication out of the container. Mix the medication with cat litter, dirt, coffee grounds, or other unwanted substance. Seal the mixture in a bag or container. Put it in the trash. NOTE: This sheet is a summary. It may not cover all possible information. If you have questions about this medicine, talk to your doctor, pharmacist, or health care provider.  2023 Elsevier/Gold Standard (2021-06-29 00:00:00) Nortriptyline Capsules What is this medication? NORTRIPTYLINE (nor TRIP ti leen) treats depression. It increases the amount of serotonin and norepinephrine in the brain, substances that help regulate mood.  It belongs to a group of medications called tricyclic antidepressants (TCAs). This medicine may be used for other purposes; ask your health care provider or pharmacist if you have questions. COMMON BRAND NAME(S): Aventyl, Pamelor What should I tell my care team before I take this medication? They need to know if you have any of these conditions: Bipolar disorder Brugada syndrome Difficulty passing urine Glaucoma Heart disease If you drink alcohol Liver disease Schizophrenia Seizures Suicidal thoughts, plans or attempt; a previous suicide attempt by you or a family member Thyroid disease An unusual or allergic reaction to nortriptyline, other tricyclic antidepressants, other medications, foods, dyes, or preservatives Pregnant or trying to get pregnant Breast-feeding How should I use this medication? Take this medication by mouth with a glass  of water. Follow the directions on the prescription label. Take your doses at regular intervals. Do not take it more often than directed. Do not stop taking this medication suddenly except upon the advice of your care team. Stopping this medication too quickly may cause serious side effects or your condition may worsen. A special MedGuide will be given to you by the pharmacist with each prescription and refill. Be sure to read this information carefully each time. Talk to your care team about the use of this medication in children. Special care may be needed. Overdosage: If you think you have taken too much of this medicine contact a poison control center or emergency room at once. NOTE: This medicine is only for you. Do not share this medicine with others. What if I miss a dose? If you miss a dose, take it as soon as you can. If it is almost time for your next dose, take only that dose. Do not take double or extra doses. What may interact with this medication? Do not take this medication with any of the  following: Cisapride Dronedarone Linezolid MAOIs, such as Marplan, Nardil, and Parnate Methylene blue (injected into a vein) Pimozide Thioridazine This medication may also interact with the following: Alcohol Antihistamines for allergy, cough, and cold Atropine Buspirone Certain medications for bladder problems, such as oxybutynin or tolterodine Certain medications for depression, such as amitriptyline, fluoxetine, sertraline Certain medications for headaches, such as sumatriptan or rizatriptan Certain medications for Parkinson disease, such as benztropine or trihexyphenidyl Certain medications for stomach problems, such as dicyclomine or hyoscyamine Certain medications for travel sickness, such as scopolamine Chlorpropamide Cimetidine Fentanyl Ipratropium Lithium Other medications that cause heart rhythm changes, such as dofetilide Quinidine Reserpine St. John's wort Thyroid medication Tramadol Tryptophan This list may not describe all possible interactions. Give your health care provider a list of all the medicines, herbs, non-prescription drugs, or dietary supplements you use. Also tell them if you smoke, drink alcohol, or use illegal drugs. Some items may interact with your medicine. What should I watch for while using this medication? Tell your care team if your symptoms do not get better or if they get worse. Visit your care team for regular checks on your progress. Because it may take several weeks to see the full effects of this medication, it is important to continue your treatment as prescribed by your care team. Patients and their families should watch out for new or worsening thoughts of suicide or depression. Also watch out for sudden changes in feelings such as feeling anxious, agitated, panicky, irritable, hostile, aggressive, impulsive, severely restless, overly excited and hyperactive, or not being able to sleep. If this happens, especially at the beginning of  treatment or after a change in dose, call your care team. You may get drowsy or dizzy. Do not drive, use machinery, or do anything that needs mental alertness until you know how this medication affects you. Do not stand or sit up quickly, especially if you are an older patient. This reduces the risk of dizzy or fainting spells. Alcohol may interfere with the effect of this medication. Avoid alcoholic drinks. Do not treat yourself for coughs, colds, or allergies without asking your care team for advice. Some ingredients can increase possible side effects. Your mouth may get dry. Chewing sugarless gum or sucking hard candy, and drinking plenty of water may help. Contact your care team if the problem does not go away or is severe. This medication may cause dry eyes and  blurred vision. If you wear contact lenses you may feel some discomfort. Lubricating drops may help. See your eye doctor if the problem does not go away or is severe. This medication can cause constipation. Try to have a bowel movement at least every 2 to 3 days. If you do not have a bowel movement for 3 days, call your care team. This medication can make you more sensitive to the sun. Keep out of the sun. If you cannot avoid being in the sun, wear protective clothing and use sunscreen. Do not use sun lamps or tanning beds/booths. What side effects may I notice from receiving this medication? Side effects that you should report to your care team as soon as possible: Allergic reactions--skin rash, itching, hives, swelling of the face, lips, tongue, or throat Heart rhythm changes--fast or irregular heartbeat, dizziness, feeling faint or lightheaded, chest pain, trouble breathing Irritability, confusion, fast or irregular heartbeat, muscle stiffness, twitching muscles, sweating, high fever, seizure, chills, vomiting, diarrhea, which may be signs of serotonin syndrome Seizures Sudden eye pain or change in vision such as blurry vision, seeing  halos around lights, vision loss Thoughts of suicide or self-harm, worsening mood, feelings of depression Trouble passing urine Side effects that usually do not require medical attention (report to your care team if they continue or are bothersome): Change in sex drive or performance Constipation Dizziness Drowsiness Dry mouth Tremors or shaking This list may not describe all possible side effects. Call your doctor for medical advice about side effects. You may report side effects to FDA at 1-800-FDA-1088. Where should I keep my medication? Keep out of the reach of children. Store at room temperature between 15 and 30 degrees C (59 and 86 degrees F). Keep container tightly closed. Throw away any unused medication after the expiration date. NOTE: This sheet is a summary. It may not cover all possible information. If you have questions about this medicine, talk to your doctor, pharmacist, or health care provider.  2023 Elsevier/Gold Standard (2020-09-14 00:00:00)

## 2022-05-06 NOTE — Progress Notes (Addendum)
[facility]    Provider:  Dr Lucia Gaskins Requesting Provider: Jarold Motto, PA Primary Care Provider:  Jarold Motto, PA  CC:  Migraine  HPI:  Antonio Santiago is a 47 y.o. male here as requested by Jarold Motto, PA for migraines. Patient has Migraine; Chronic midline thoracic back pain; Vitamin D deficiency; and HLA B27 (HLA B27 positive) on their problem list.  Migraines started 20 years ago or longer. Father had migraines. Sister has migraines. On the right side, never on the left, pulsating, pounding, throbbing, behing the right eye and right side of, radiates to the next, photo/phonophobia, nausea no vomiting. Had them last up to a week once, usually about 16-24 hours. Can be moderate to severe. Rizatriptan helps in one hour. 8-10 migraine days in a month. No changes in frequency, quality, severity. He tried a vegetarian diet and that helped, food may be a trigger. >15 headache days a month. No aura. No medication overuse. No other focal neurologic deficits, associated symptoms, inciting events or modifiable factors.   Reviewed notes, labs and imaging from outside physicians, which showed:   From a thorough review of records, medications tried that can be used in migraine management include: Celebrex Flexeril Voltaren tablet Flexeril, Voltaren tablet, Mobic, Robaxin, metoprolol, methylprednisone, sumatriptan, nortriptyline, maxalt, topiramate, aimovig contraindicated due to constipation     Latest Ref Rng & Units 03/31/2022    9:35 AM 08/18/2021   11:09 AM 11/05/2020    9:00 AM  CBC  WBC 4.0 - 10.5 K/uL 7.0  5.6  5.4   Hemoglobin 13.0 - 17.0 g/dL 16.1  09.6  04.5   Hematocrit 39.0 - 52.0 % 44.9  42.4  44.7   Platelets 150.0 - 400.0 K/uL 220.0  224.0  223.0       Latest Ref Rng & Units 03/31/2022    9:35 AM 08/18/2021   11:09 AM 11/05/2020    9:00 AM  CMP  Glucose 70 - 99 mg/dL 92  87  81   BUN 6 - 23 mg/dL 11  11  15    Creatinine 0.40 - 1.50 mg/dL 4.09  8.11   9.14   Sodium 135 - 145 mEq/L 143  140  140   Potassium 3.5 - 5.1 mEq/L 4.2  4.1  3.8   Chloride 96 - 112 mEq/L 104  103  104   CO2 19 - 32 mEq/L 31  31  29    Calcium 8.4 - 10.5 mg/dL 9.2  9.2  9.2   Total Protein 6.0 - 8.3 g/dL 6.9  7.0  7.1   Total Bilirubin 0.2 - 1.2 mg/dL 0.8  0.7  0.7   Alkaline Phos 39 - 117 U/L 61  60  53   AST 0 - 37 U/L 14  19  17    ALT 0 - 53 U/L 15  20  17       Review of Systems: Patient complains of symptoms per HPI as well as the following symptoms migraine. Pertinent negatives and positives per HPI. All others negative.   Social History   Socioeconomic History   Marital status: Married    Spouse name: Not on file   Number of children: Not on file   Years of education: Not on file   Highest education level: Not on file  Occupational History   Not on file  Tobacco Use   Smoking status: Never    Passive exposure: Never   Smokeless tobacco: Never  Vaping Use   Vaping Use: Never  used  Substance and Sexual Activity   Alcohol use: Not Currently   Drug use: Never   Sexual activity: Not on file  Other Topics Concern   Not on file  Social History Narrative   Not on file   Social Determinants of Health   Financial Resource Strain: Not on file  Food Insecurity: Not on file  Transportation Needs: Not on file  Physical Activity: Not on file  Stress: Not on file  Social Connections: Not on file  Intimate Partner Violence: Not on file    Family History  Problem Relation Age of Onset   Esophageal cancer Father    Stomach cancer Father    Healthy Sister    Healthy Sister    Healthy Daughter     Past Medical History:  Diagnosis Date   Migraines     Patient Active Problem List   Diagnosis Date Noted   HLA B27 (HLA B27 positive) 04/14/2021   Vitamin D deficiency 05/12/2020   Migraine 06/26/2019   Chronic midline thoracic back pain 06/26/2019    Past Surgical History:  Procedure Laterality Date   NO PAST SURGERIES      Current  Outpatient Medications  Medication Sig Dispense Refill   rizatriptan (MAXALT) 10 MG tablet Take 1 tablet (10 mg total) by mouth as needed for migraine. May repeat in 2 hours if needed 10 tablet 2   topiramate (TOPAMAX) 50 MG tablet Take 1 tablet (50 mg total) by mouth at bedtime. 30 tablet 6   No current facility-administered medications for this visit.    Allergies as of 05/06/2022   (No Known Allergies)    Vitals: BP 118/70   Pulse 75   Ht 6' (1.829 m)   Wt 167 lb 9.6 oz (76 kg)   BMI 22.73 kg/m  Last Weight:  Wt Readings from Last 1 Encounters:  05/06/22 167 lb 9.6 oz (76 kg)   Last Height:   Ht Readings from Last 1 Encounters:  05/06/22 6' (1.829 m)     Physical exam: Exam: Gen: NAD, conversant, well nourised well groomed                     CV: RRR, no MRG. No Carotid Bruits. No peripheral edema, warm, nontender Eyes: Conjunctivae clear without exudates or hemorrhage  Neuro: Detailed Neurologic Exam  Speech:    Speech is normal; fluent and spontaneous with normal comprehension.  Cognition:    The patient is oriented to person, place, and time;     recent and remote memory intact;     language fluent;     normal attention, concentration,     fund of knowledge Cranial Nerves:    The pupils are equal, round, and reactive to light. The fundi are normal and spontaneous venous pulsations are present. Visual fields are full to finger confrontation. Extraocular movements are intact. Trigeminal sensation is intact and the muscles of mastication are normal. The face is symmetric. The palate elevates in the midline. Hearing intact. Voice is normal. Shoulder shrug is normal. The tongue has normal motion without fasciculations.   Coordination:    Normal finger to nose and heel to shin.   Gait: nml  Motor Observation:    No asymmetry, no atrophy, and no involuntary movements noted. Tone:    Normal muscle tone.    Posture:    Posture is normal. normal erect     Strength:    Strength is V/V in the upper and lower  limbs.      Sensation: intact to LT     Reflex Exam:  DTR's:    Deep tendon reflexes in the upper and lower extremities are normal bilaterally.   Toes:    The toes are downgoing bilaterally.   Clonus:    Clonus is absent.    Assessment/Plan:  Patient with chronic migraines. No red flags for imaging but low threshold should they not improve.  Options for prevention (decrease migraine overall):   Failed Topiramate(numbness and tingling fingers and toes), Fauked Nortriptyline(very groggy and tired). Recommend start Botox(every 3 months)   Discussed: To prevent or relieve headaches, try the following: Cool Compress. Lie down and place a cool compress on your head.  Avoid headache triggers. If certain foods or odors seem to have triggered your migraines in the past, avoid them. A headache diary might help you identify triggers.  Include physical activity in your daily routine. Try a daily walk or other moderate aerobic exercise.  Manage stress. Find healthy ways to cope with the stressors, such as delegating tasks on your to-do list.  Practice relaxation techniques. Try deep breathing, yoga, massage and visualization.  Eat regularly. Eating regularly scheduled meals and maintaining a healthy diet might help prevent headaches. Also, drink plenty of fluids.  Follow a regular sleep schedule. Sleep deprivation might contribute to headaches Consider biofeedback. With this mind-body technique, you learn to control certain bodily functions -- such as muscle tension, heart rate and blood pressure -- to prevent headaches or reduce headache pain.    Proceed to emergency room if you experience new or worsening symptoms or symptoms do not resolve, if you have new neurologic symptoms or if headache is severe, or for any concerning symptom.   Provided education and documentation from American headache Society toolbox including articles on: chronic  migraine medication overuse headache, chronic migraines, prevention of migraines, behavioral and other nonpharmacologic treatments for headache.   No orders of the defined types were placed in this encounter.  Meds ordered this encounter  Medications   topiramate (TOPAMAX) 50 MG tablet    Sig: Take 1 tablet (50 mg total) by mouth at bedtime.    Dispense:  30 tablet    Refill:  6    Cc: Jarold Motto, [place],  [place], [place], [place]  Naomie Dean, MD  [facility] [address] [place], [place] [address]  Phone [phone] Fax [phone]  I spent 45 minutes of face-to-face and non-face-to-face time with patient on the  1. Chronic migraine with aura without status migrainosus, not intractable    diagnosis.  This included previsit chart review, lab review, study review, order entry, electronic health record documentation, patient education on the different diagnostic and therapeutic options, counseling and coordination of care, risks and benefits of management, compliance, or risk factor reduction

## 2022-05-09 ENCOUNTER — Encounter: Payer: Self-pay | Admitting: Neurology

## 2022-05-09 ENCOUNTER — Ambulatory Visit (INDEPENDENT_AMBULATORY_CARE_PROVIDER_SITE_OTHER): Payer: 59 | Admitting: Physician Assistant

## 2022-05-09 ENCOUNTER — Encounter: Payer: Self-pay | Admitting: Physician Assistant

## 2022-05-09 VITALS — BP 110/70 | HR 72 | Ht 72.0 in | Wt 167.5 lb

## 2022-05-09 DIAGNOSIS — R000 Tachycardia, unspecified: Secondary | ICD-10-CM

## 2022-05-09 NOTE — Progress Notes (Signed)
Antonio Santiago is a 47 y.o. male here for a new problem.  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  Last note from cardiology on 12/21/21 reviewed. He was given heart rate monitor to wear and results showed overall benign results. He was instructed to take metoprolol 12.5 mg BID which he did for awhile and is no longer taking. He is unable to tell me why he is taking this.   Past Medical History:  Diagnosis Date   Migraines      Social History   Tobacco Use   Smoking status: Never    Passive exposure: Never   Smokeless tobacco: Never  Vaping Use   Vaping Use: Never used  Substance Use Topics   Alcohol use: Not Currently   Drug use: Never    Past Surgical History:  Procedure Laterality Date   NO PAST SURGERIES      Family History  Problem Relation Age of Onset   Esophageal cancer Father    Stomach cancer Father    Healthy Sister    Healthy Sister    Healthy Daughter     No Known Allergies  Current Medications:   Current Outpatient Medications:    rizatriptan (MAXALT) 10 MG tablet, Take 1 tablet (10 mg total) by mouth as needed for migraine. May repeat in 2 hours if needed, Disp: 10 tablet, Rfl: 2   topiramate (TOPAMAX) 50 MG tablet, Take 1 tablet (50 mg total) by mouth at bedtime., Disp: 30 tablet, Rfl: 6   Review of Systems:   ROS Negative unless otherwise specified per HPI.  Vitals:   Vitals:   05/09/22 1358  BP: 110/70  Pulse: (!) 156  SpO2: 98%  Weight: 167 lb 8 oz (76 kg)  Height: 6' (1.829 m)     Body mass index is 22.72 kg/m.  Physical Exam:   Physical Exam Vitals and nursing note reviewed.  Constitutional:      General: He is not in  acute distress.    Appearance: He is well-developed. He is not ill-appearing or toxic-appearing.  Cardiovascular:     Rate and Rhythm: Normal rate and regular rhythm.     Pulses: Normal pulses.     Heart sounds: Normal heart sounds, S1 normal and S2 normal.  Pulmonary:     Effort: Pulmonary effort is normal.     Breath sounds: Normal breath sounds.  Skin:    General: Skin is warm and dry.  Neurological:     Mental Status: He is alert.     GCS: GCS eye subscore is 4. GCS verbal subscore is 5. GCS motor subscore is 6.  Psychiatric:        Speech: Speech normal.        Behavior: Behavior normal. Behavior is cooperative.     Assessment and Plan:   Tachycardia Initially had tachycardia in our office and then HR became normal for a continuous 5 minutes Denies chest pain, SOB He was instructed to call his wife to go to the ER vs EMS but refused both Refused EKG and blood work He refuses to go to the ER because HR is now normal and "they  will not do anything for me." I recommended that he reach out to his cardiologist regarding this  He verbalized understanding that by leaving here and not going to the ER that he is putting himself at risk for possible serious health concerns - he verbalized understanding to plan and states that next time his watch tells him about his elevated heart rate, he will go to the ER.  I,Rachel Rivera,acting as a Education administrator for [employer], PA.,have documented all relevant documentation on the behalf of Inda Coke, PA,as directed by  Inda Coke, PA while in the presence of Inda Coke, [place].  I, Inda Coke, [place], have reviewed all documentation for this visit. The documentation on 05/09/22 for the exam, diagnosis, procedures, and orders are all accurate and complete.  I spent a total of 38 minutes on this visit, today 05/09/22, which included reviewing previous notes from cardiology, discussing plan of care with patient and using shared-decision  making on next steps, reviewing plan of care with supervising physician, routing note and message to cardiologist, and documenting the findings in the note.  Inda Coke, PA-C

## 2022-05-16 ENCOUNTER — Other Ambulatory Visit: Payer: Self-pay | Admitting: Neurology

## 2022-05-16 DIAGNOSIS — G43E09 Chronic migraine with aura, not intractable, without status migrainosus: Secondary | ICD-10-CM

## 2022-05-16 MED ORDER — NORTRIPTYLINE HCL 25 MG PO CAPS: 25.0000 mg | ORAL_CAPSULE | Freq: Every day | ORAL | 3 refills | Status: DC

## 2022-05-18 ENCOUNTER — Encounter: Payer: Self-pay | Admitting: Cardiology

## 2022-06-07 ENCOUNTER — Encounter: Payer: Self-pay | Admitting: Physician Assistant

## 2022-06-13 ENCOUNTER — Telehealth: Payer: Self-pay | Admitting: *Deleted

## 2022-06-13 NOTE — Telephone Encounter (Signed)
Please start Botox approval, he failed all the medications we tried see below inckuding trying some new ones, notes updated below thanks g43.709   Antonio Santiago is a 47 y.o. male here as requested by Jarold Motto, PA for migraines. Patient has Migraine; Chronic midline thoracic back pain; Vitamin D deficiency; and HLA B27 (HLA B27 positive) on their problem list.  Migraines started 20 years ago or longer. Father had migraines. Sister has migraines. On the right side, never on the left, pulsating, pounding, throbbing, behing the right eye and right side of, radiates to the next, photo/phonophobia, nausea no vomiting. Had them last up to a week once, usually about 16-24 hours. Can be moderate to severe. Rizatriptan helps in one hour. 8-10 migraine days in a month. No changes in frequency, quality, severity. He tried a vegetarian diet and that helped, food may be a trigger. >15 headache days a month. No aura. No medication overuse. No other focal neurologic deficits, associated symptoms, inciting events or modifiable factors.   Reviewed notes, labs and imaging from outside physicians, which showed:   From a thorough review of records, medications tried that can be used in migraine management include: Celebrex Flexeril Voltaren tablet Flexeril, Voltaren tablet, Mobic, Robaxin, metoprolol, methylprednisone, sumatriptan, nortriptyline, maxalt, topiramate, aimovig contraindicated due to constipation

## 2022-06-13 NOTE — Telephone Encounter (Signed)
New BOTOX start.  Chronic Migraine CPT 64615    Botox J0585 Units:200  G43.709 Chronic Migraine without aura, not intractable, without status migrainous

## 2022-06-14 NOTE — Telephone Encounter (Signed)
Chronic Migraine CPT 64615    Botox J0585 Units:155  G43.709 Chronic Migraine without aura, not intractable, without status migrainous  

## 2022-06-22 ENCOUNTER — Other Ambulatory Visit (HOSPITAL_COMMUNITY): Payer: Self-pay

## 2022-06-22 ENCOUNTER — Telehealth: Payer: Self-pay

## 2022-06-22 NOTE — Telephone Encounter (Signed)
PA request submitted. New Encounter created for follow up. For additional info see Prior Auth telephone encounter from 06/22/2022. 

## 2022-06-22 NOTE — Telephone Encounter (Signed)
   Benefit Verification [id number] Submitted!

## 2022-06-24 ENCOUNTER — Other Ambulatory Visit (HOSPITAL_COMMUNITY): Payer: Self-pay

## 2022-06-24 NOTE — Telephone Encounter (Signed)
Pharmacy Patient Advocate Encounter   Received notification from [facility] that prior authorization for Botox 200 units is required/requested.   PA submitted on 06/24/2022 to (ins) [employer] via [employer] or [facility]) confirmation # [id number] Status is pending

## 2022-06-24 NOTE — Telephone Encounter (Signed)
Sent [employer] Botox form along with clinicals-faxed to [phone].

## 2022-06-28 NOTE — Telephone Encounter (Signed)
Update: we received a fax from [facility] confirming pt has active [employer] and they have received the fax that [facility] sent.

## 2022-06-30 ENCOUNTER — Encounter: Payer: Self-pay | Admitting: Cardiology

## 2022-07-08 ENCOUNTER — Encounter: Payer: Self-pay | Admitting: Rheumatology

## 2022-07-21 ENCOUNTER — Encounter: Payer: Self-pay | Admitting: Cardiology

## 2022-07-21 ENCOUNTER — Ambulatory Visit: Payer: 59 | Admitting: Physician Assistant

## 2022-07-22 ENCOUNTER — Other Ambulatory Visit: Payer: Self-pay | Admitting: Cardiology

## 2022-07-22 IMAGING — CT CT RENAL STONE PROTOCOL
2 of 3 series · 16 of 46 positions shown, 18 images · non-contrast
Comparison: None.

CLINICAL DATA: Flank pain.  Suspected renal colic.

EXAM:
CT ABDOMEN AND PELVIS WITHOUT CONTRAST
TECHNIQUE: Multidetector CT imaging of the abdomen and pelvis was performed
following the standard protocol without IV contrast.

[Series 4: coronal · coronal · 0.82mm/px · 3 of 151 slices shown]
[im 51/151  soft-tissue]
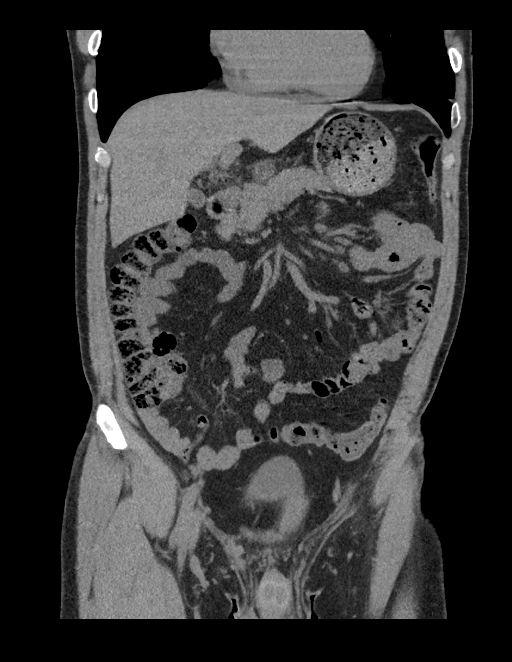
[im 67/151  soft-tissue]
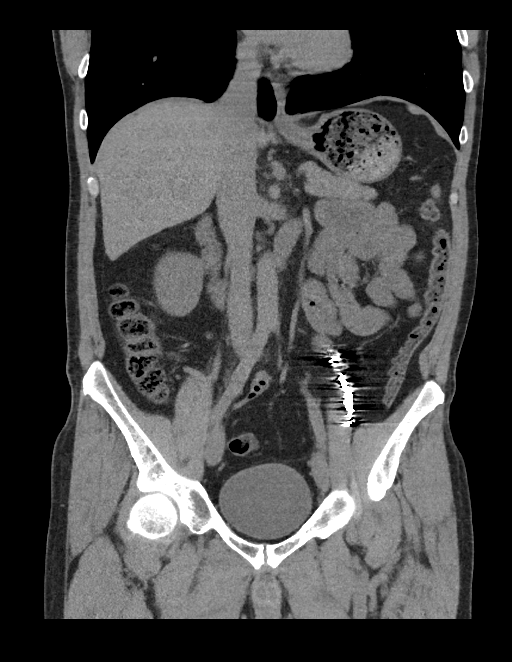
[im 84/151  soft-tissue]
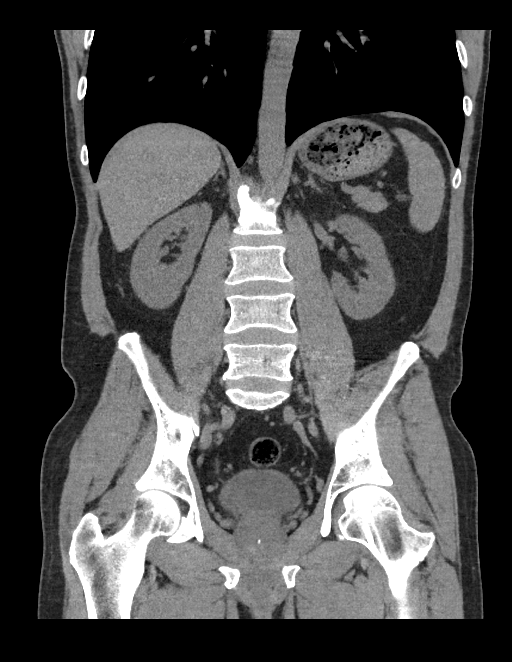

[Series 6: lung bases · axial · 0.70mm/px · z∈[-199,-67]mm · 13 of 76 slices shown, 15 images]
[im 5/76  soft-tissue]
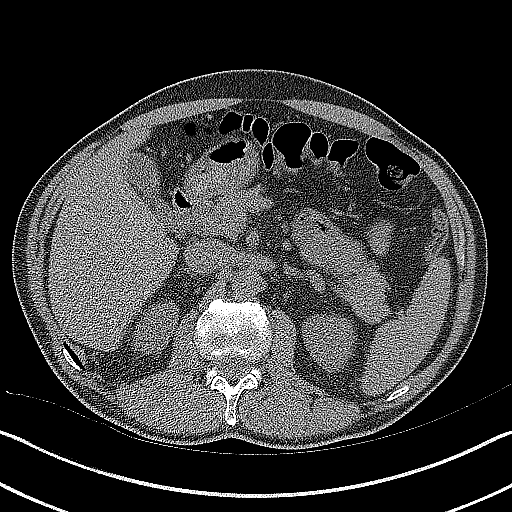
[im 5/76  bone]
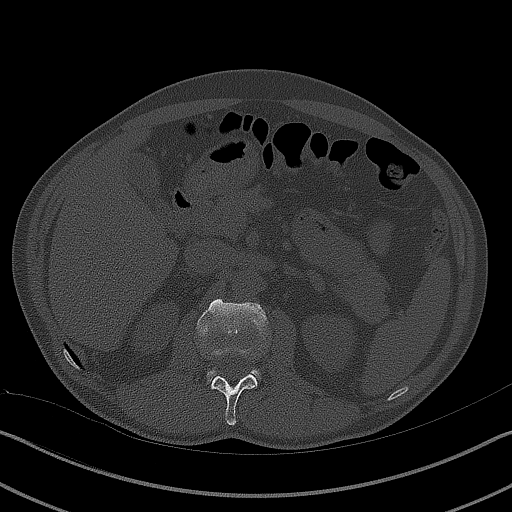
[im 10/76  soft-tissue]
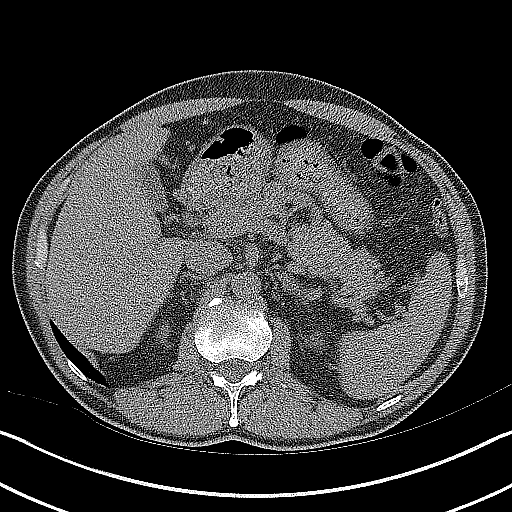
[im 15/76  soft-tissue]
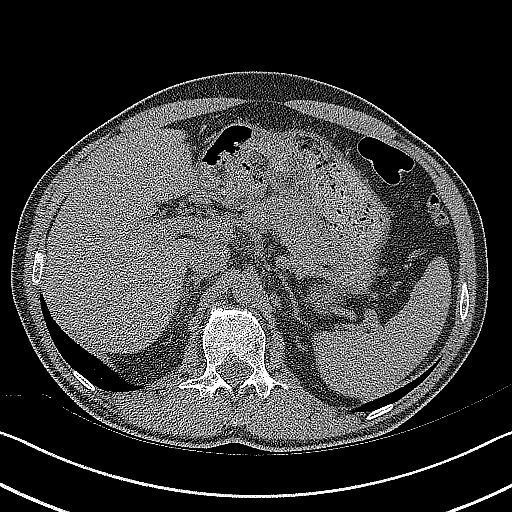
[im 22/76  soft-tissue]
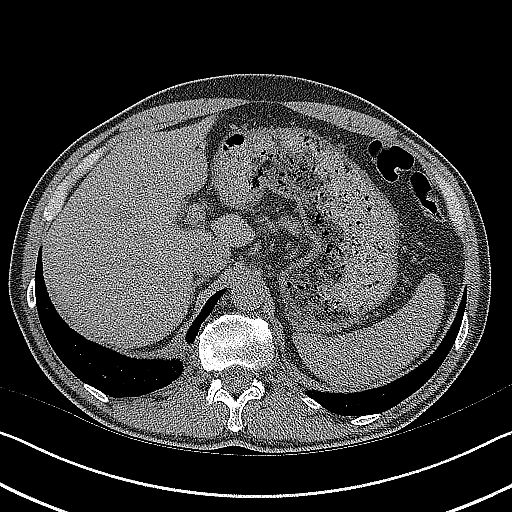
[im 27/76  soft-tissue]
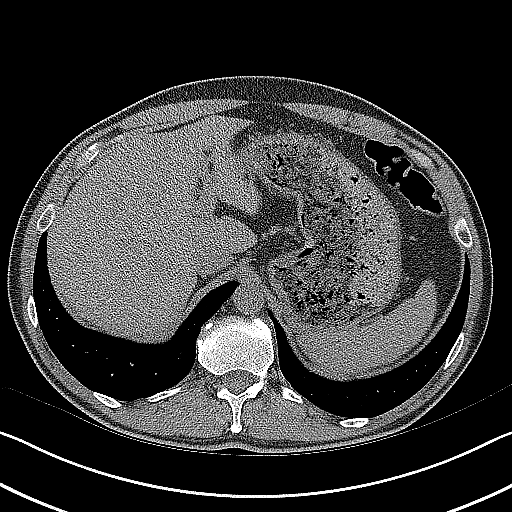
[im 32/76  soft-tissue]
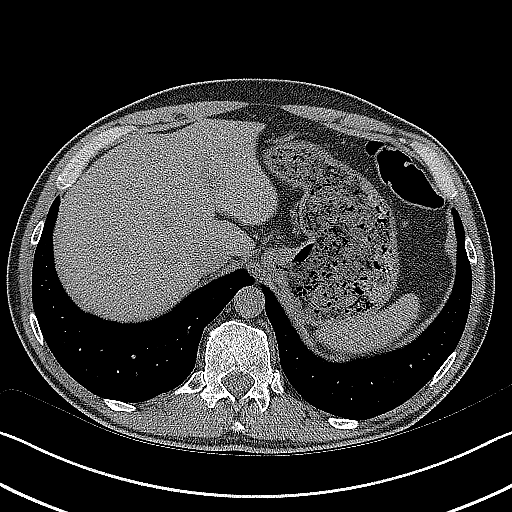
[im 39/76  soft-tissue]
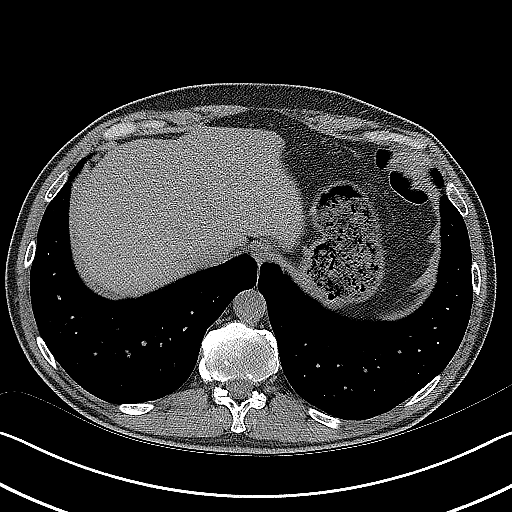
[im 44/76  soft-tissue]
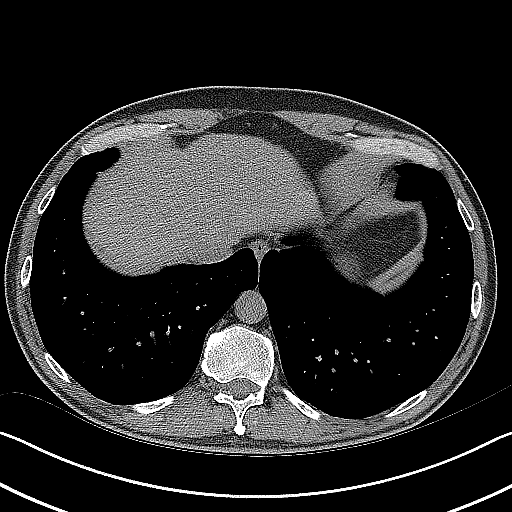
[im 49/76  soft-tissue]
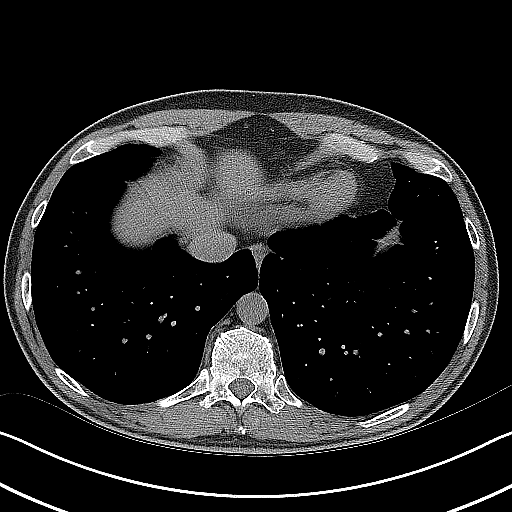
[im 49/76  bone]
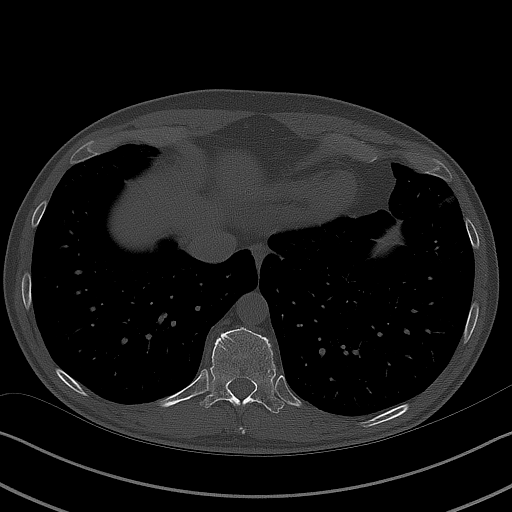
[im 54/76  soft-tissue]
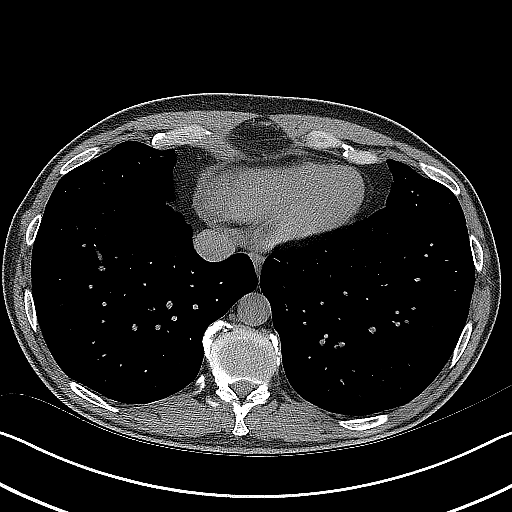
[im 61/76  soft-tissue]
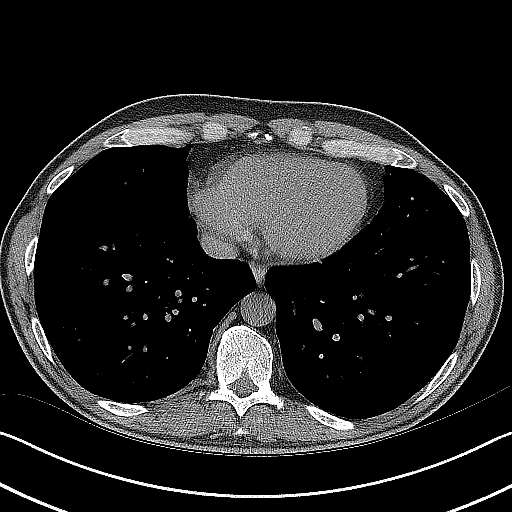
[im 66/76  soft-tissue]
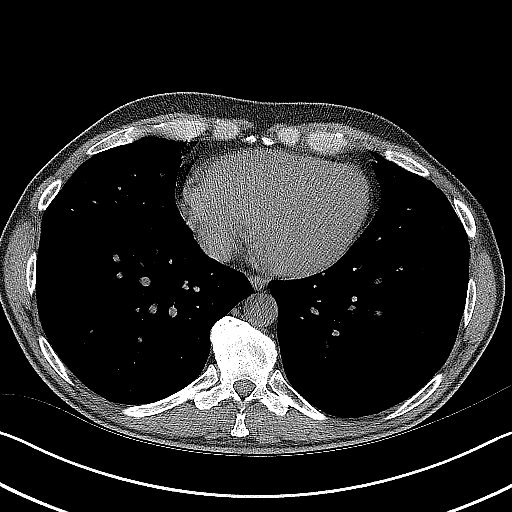
[im 71/76  soft-tissue]
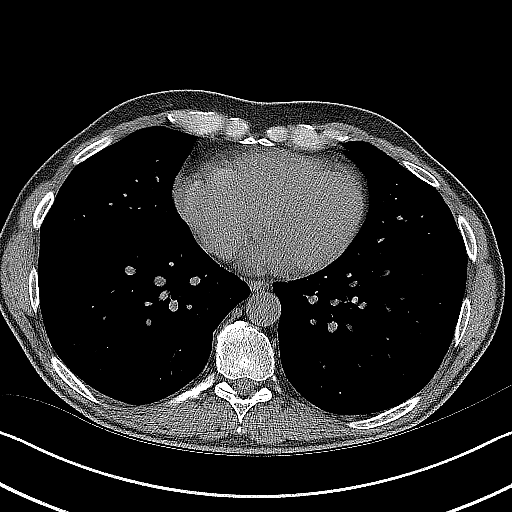

[16 of 46 positions shown; findings below may reference images not displayed]

FINDINGS: Lower chest: No acute findings.

Hepatobiliary: No mass visualized on this unenhanced exam.
Gallbladder is unremarkable. No evidence of biliary ductal
dilatation.

Pancreas: No mass or inflammatory process visualized on this
unenhanced exam.

Spleen:  Within normal limits in size.

Adrenals/Urinary tract: No evidence of urolithiasis or
hydronephrosis. Unremarkable unopacified urinary bladder.

Stomach/Bowel: No evidence of obstruction, inflammatory process, or
abnormal fluid collections. Normal appendix visualized.

Vascular/Lymphatic: No pathologically enlarged lymph nodes
identified. No evidence of abdominal aortic aneurysm. Embolization
coils again seen along the expected course of the left gonadal vein.

Reproductive:  No mass or other significant abnormality.

Other:  None.

Musculoskeletal:  No suspicious bone lesions identified.
IMPRESSION: No evidence of urolithiasis, hydronephrosis, or other acute
findings.

## 2022-07-22 MED ORDER — DILTIAZEM HCL 30 MG PO TABS: 15.0000 mg | ORAL_TABLET | Freq: Two times a day (BID) | ORAL | 3 refills | Status: DC

## 2022-07-26 ENCOUNTER — Ambulatory Visit (INDEPENDENT_AMBULATORY_CARE_PROVIDER_SITE_OTHER): Payer: 59 | Admitting: Physician Assistant

## 2022-07-26 ENCOUNTER — Encounter: Payer: Self-pay | Admitting: Physician Assistant

## 2022-07-26 VITALS — BP 110/70 | HR 63 | Temp 98.2°F | Ht 72.0 in | Wt 164.0 lb

## 2022-07-26 DIAGNOSIS — N529 Male erectile dysfunction, unspecified: Secondary | ICD-10-CM | POA: Diagnosis not present

## 2022-07-26 DIAGNOSIS — E781 Pure hyperglyceridemia: Secondary | ICD-10-CM

## 2022-07-26 DIAGNOSIS — E559 Vitamin D deficiency, unspecified: Secondary | ICD-10-CM

## 2022-07-26 LAB — COMPREHENSIVE METABOLIC PANEL
ALT: 17 U/L (ref 0–53)
AST: 17 U/L (ref 0–37)
Albumin: 4.6 g/dL (ref 3.5–5.2)
Alkaline Phosphatase: 56 U/L (ref 39–117)
BUN: 11 mg/dL (ref 6–23)
CO2: 29 mEq/L (ref 19–32)
Calcium: 9.1 mg/dL (ref 8.4–10.5)
Chloride: 104 mEq/L (ref 96–112)
Creatinine, Ser: 0.79 mg/dL (ref 0.40–1.50)
GFR: 106.91 mL/min (ref >60.00)
Glucose, Bld: 91 mg/dL (ref 70–99)
Potassium: 4.1 mEq/L (ref 3.5–5.1)
Sodium: 141 mEq/L (ref 135–145)
Total Bilirubin: 0.7 mg/dL (ref 0.2–1.2)
Total Protein: 7.1 g/dL (ref 6.0–8.3)

## 2022-07-26 LAB — CBC WITH DIFFERENTIAL/PLATELET
Basophils Absolute: 0 10*3/uL (ref 0.0–0.1)
Basophils Relative: 0.6 % (ref 0.0–3.0)
Eosinophils Absolute: 0 10*3/uL (ref 0.0–0.7)
Eosinophils Relative: 0.2 % (ref 0.0–5.0)
HCT: 43.9 % (ref 39.0–52.0)
Hemoglobin: 14.7 g/dL (ref 13.0–17.0)
Lymphocytes Relative: 23.5 % (ref 12.0–46.0)
Lymphs Abs: 1.7 10*3/uL (ref 0.7–4.0)
MCHC: 33.5 g/dL (ref 30.0–36.0)
MCV: 91.8 fl (ref 78.0–100.0)
Monocytes Absolute: 0.4 10*3/uL (ref 0.1–1.0)
Monocytes Relative: 5.9 % (ref 3.0–12.0)
Neutro Abs: 5.2 10*3/uL (ref 1.4–7.7)
Neutrophils Relative %: 69.8 % (ref 43.0–77.0)
Platelets: 235 10*3/uL (ref 150.0–400.0)
RBC: 4.78 Mil/uL (ref 4.22–5.81)
RDW: 13.3 % (ref 11.5–15.5)
WBC: 7.4 10*3/uL (ref 4.0–10.5)

## 2022-07-26 LAB — LIPID PANEL
Cholesterol: 150 mg/dL (ref 0–200)
HDL: 49.2 mg/dL (ref >39.00)
LDL Cholesterol: 88 mg/dL (ref 0–99)
NonHDL: 100.9
Total CHOL/HDL Ratio: 3
Triglycerides: 63 mg/dL (ref 0.0–149.0)
VLDL: 12.6 mg/dL (ref 0.0–40.0)

## 2022-07-26 LAB — VITAMIN D 25 HYDROXY (VIT D DEFICIENCY, FRACTURES): VITD: 39.72 ng/mL (ref 30.00–100.00)

## 2022-07-26 LAB — HEMOGLOBIN A1C: Hgb A1c MFr Bld: 5.1 % (ref 4.6–6.5)

## 2022-07-26 LAB — PSA: PSA: 1.04 ng/mL (ref 0.10–4.00)

## 2022-07-26 MED ORDER — TADALAFIL 10 MG PO TABS: 10.0000 mg | ORAL_TABLET | ORAL | 1 refills | Status: DC | PRN

## 2022-07-26 NOTE — Progress Notes (Signed)
Keyonn Kubick is a 47 y.o. male here for a new problem.  History of Present Illness:   Chief Complaint  Patient presents with   Personal Problem    HPI  Erectile Dysfunction Patient reports intermittent history with this Recently started taking metoprolol again for his palpitations He feels as though this is contributing to his symptom(s) He messaged his cardiologist who agreed and has sent in diltiazem for him - he has yet to start this  He is requesting update of all blood work today He is requesting PSA as well   Past Medical History:  Diagnosis Date   Migraines      Social History   Tobacco Use   Smoking status: Never    Passive exposure: Never   Smokeless tobacco: Never  Vaping Use   Vaping Use: Never used  Substance Use Topics   Alcohol use: Not Currently   Drug use: Never    Past Surgical History:  Procedure Laterality Date   NO PAST SURGERIES      Family History  Problem Relation Age of Onset   Esophageal cancer Father    Stomach cancer Father    Healthy Sister    Healthy Sister    Healthy Daughter     No Known Allergies  Current Medications:   Current Outpatient Medications:    diltiazem (CARDIZEM) 30 MG tablet, Take 0.5 tablets (15 mg total) by mouth 2 (two) times daily., Disp: 180 tablet, Rfl: 3   metoprolol tartrate (LOPRESSOR) 25 MG tablet, Take 12.5 mg by mouth 2 (two) times daily., Disp: , Rfl:    nortriptyline (PAMELOR) 25 MG capsule, Take 1 capsule (25 mg total) by mouth at bedtime., Disp: 30 capsule, Rfl: 3   rizatriptan (MAXALT) 10 MG tablet, Take 1 tablet (10 mg total) by mouth as needed for migraine. May repeat in 2 hours if needed, Disp: 10 tablet, Rfl: 2   tadalafil (CIALIS) 10 MG tablet, Take 1 tablet (10 mg total) by mouth every other day as needed for erectile dysfunction., Disp: 10 tablet, Rfl: 1   topiramate (TOPAMAX) 50 MG tablet, Take 1 tablet (50 mg total) by mouth at bedtime., Disp: 30 tablet, Rfl: 6   Review of Systems:    ROS Negative unless otherwise specified per HPI.  Vitals:   Vitals:   07/26/22 1257  BP: 110/70  Pulse: 63  Temp: 98.2 F (36.8 C)  TempSrc: Temporal  SpO2: 98%  Weight: 164 lb (74.4 kg)  Height: 6' (1.829 m)     Body mass index is 22.24 kg/m.  Physical Exam:   Physical Exam Vitals and nursing note reviewed.  Constitutional:      Appearance: He is well-developed.  HENT:     Head: Normocephalic.  Eyes:     Conjunctiva/sclera: Conjunctivae normal.     Pupils: Pupils are equal, round, and reactive to light.  Pulmonary:     Effort: Pulmonary effort is normal.  Musculoskeletal:        General: Normal range of motion.     Cervical back: Normal range of motion.  Skin:    General: Skin is warm and dry.  Neurological:     Mental Status: He is alert and oriented to person, place, and time.  Psychiatric:        Behavior: Behavior normal.        Thought Content: Thought content normal.        Judgment: Judgment normal.     Assessment and Plan:   Erectile  dysfunction, unspecified erectile dysfunction type Suspect related to metoprolol Recommended that he switch to diltiazem that was already sent in by cardiology I have given him prescription for Cialis to use as needed Side effect(s), risks, benefits discussed Offered referral to urology but he declined He is also requesting PSA testing today - discussed that this is not recommended in his age/risk group but he wants to proceed with this-- will send any abnormalities to urology  Vitamin D deficiency Update vitamin D per his request and advice accordingly  Hypertriglyceridemia Update lipid panel per his request and advice accordingly    Jarold Motto, PA-C

## 2022-07-26 NOTE — Patient Instructions (Signed)
It was great to see you!  Stop metoprolol and start diltiazem -- this was sent in to your pharmacy by your cardiologist  May trial as needed Cialis -- use the paper prescription with [facility] as it will likely be more affordable this way  We will update blood work today  Send me a MyChart message if you would like referral to urology or have other concerns.  Take care,  Jarold Motto PA-C

## 2022-10-31 ENCOUNTER — Other Ambulatory Visit: Payer: Self-pay | Admitting: Physician Assistant

## 2022-12-03 DIAGNOSIS — Z8739 Personal history of other diseases of the musculoskeletal system and connective tissue: Secondary | ICD-10-CM | POA: Diagnosis not present

## 2022-12-03 DIAGNOSIS — M5441 Lumbago with sciatica, right side: Secondary | ICD-10-CM | POA: Diagnosis not present

## 2023-03-21 ENCOUNTER — Telehealth: Payer: Self-pay | Admitting: Nurse Practitioner

## 2023-03-21 NOTE — Telephone Encounter (Signed)
Pt sent this via mychart to the scheduling pool:   Sometimes goes 168, Yes i do , my watch. I feel empty on my chest.     Good afternoon,  Could you please answer the following questions?  What is your heart rate?   Do you have a log of your heart rate readings (document readings)?   Do you have any other symptoms?       Appointment Request From: Antonio Santiago  With Provider: [facility]]  Preferred Date Range: Any  Preferred Times: Any Time  Reason for visit: Office Visit  Health Maintenance Topic:   Comments: Heart rate faster

## 2023-03-21 NOTE — Telephone Encounter (Addendum)
Spoke with patient and he states this month he has episodes of elevated heart rate. On Sunday it was up to 167 and his chest was tight. He states that the episode lasted for two hours and his medication did not help. He is not currently experiencing any symptoms. He can not tell me what his heart rate is currently but he states he knows its not elevated. He will continue to monitor HR.  He is a former Dr. Shari Prows patient. He would like to follow Dr. Duke Salvia now.   ED precautions discussed.

## 2023-03-22 NOTE — Telephone Encounter (Signed)
Agree with plan by Serita Sheller, LPN. He should record any information that he can in relation to HR, BP and symptoms and bring to next appointment.

## 2023-03-28 ENCOUNTER — Ambulatory Visit: Payer: 59 | Attending: Physician Assistant | Admitting: Physician Assistant

## 2023-03-28 VITALS — BP 110/62 | HR 62 | Ht 68.0 in | Wt 170.0 lb

## 2023-03-28 DIAGNOSIS — R000 Tachycardia, unspecified: Secondary | ICD-10-CM

## 2023-03-28 DIAGNOSIS — R002 Palpitations: Secondary | ICD-10-CM

## 2023-03-28 MED ORDER — DILTIAZEM HCL 30 MG PO TABS: 15.0000 mg | ORAL_TABLET | Freq: Two times a day (BID) | ORAL | 2 refills | Status: DC

## 2023-03-28 NOTE — Progress Notes (Signed)
 Cardiology Office Note:  .   Date:  03/28/2023  ID:  Antonio Santiago, DOB 12-26-1975, MRN [id number] PCP: Antonio Lukes, PA  [facility] Providers Cardiologist:  Antonio FORBES Sorrow, MD (Inactive) {  History of Present Illness: .   Antonio Santiago is a 48 y.o. male with a history of chronic back pain, migraines, palpitations who presents for follow-up appointment.  Was last seen 12/21/2021.  Initially referred 06/2020 for palpitations.  Cardiac monitor revealed predominantly normal sinus rhythm with average heart rate of 74 bpm with 1 run of nonsustained VT lasting 4 beats, 2 runs of SVT with fastest lasting 8 beats at 160 bpm, rare SVE, rare VE, no sustained arrhythmias or significant pauses.  Patient triggered events correlated with SVE's.  We recommended continuing surveillance.  A call was received on 11/04/2021 and patient was having heart rates from the 130s to 160s bpm.  This was sustained for up to 2 hours.  Repeat cardiac monitor was recommended but was not performed.  TTE 10/2021 showed LVEF 55 to 60%, normal diastolic function, normal RV, no significant valvular disease.   When he was last seen he presented with close family friend and he states he been doing okay.  Having some palpitations that his Apple Watch said his heart rate reached 174.  Family friend believes the palpitations happen more frequently.  Was not modulated by exercise and occurred when he was standing.  Symptoms can last for several minutes without abating.  He does have associated chest pain at the time.  He was post get a heart monitor but never received and was unsure what happened.  He checked his mail every day and was unable to find it.  He thinks that they may have sent it to the wrong address.  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  He denied any shortness of breath or peripheral edema.  No lightheadedness, headaches, syncope, orthopnea, or PND.  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 The patient reports that the palpitations have been occurring more frequently in the past two months, despite being well-controlled for about a year prior. The patient uses a watch to monitor his heart rate and has noticed it spiking to around 169 beats per minute during these episodes. The patient describes the sensation as a vigorous heartbeat and reports feeling dizzy during these episodes. However, the patient does not report any other symptoms or discomfort on days without palpitations. The patient is currently on a low dose of Cardizem  (diltiazem ), a calcium channel blocker, for the palpitations and metoprolol  BID. The patient has tried taking an extra half dose of Cardizem  during episodes, which initially provided immediate relief but was less effective during the most recent episodes. Could have been out of date medication that he was using.  Reports no shortness of breath nor dyspnea on exertion. Reports no chest pain, pressure, or tightness. No edema, orthopnea, PND.   Discussed the use of AI scribe software for clinical note transcription with the patient, who gave verbal consent to proceed.  ROS: Pertinent ROS in HPI  Studies Reviewed: SABRA   EKG Interpretation Date/Time:  Tuesday March 28 2023 14:51:47 EST Ventricular Rate:  62 PR Interval:  166 QRS Duration:  94 QT Interval:  402 QTC Calculation: 408 R Axis:   77  Text Interpretation: Normal sinus rhythm Normal ECG No previous ECGs available  Confirmed by Lucien Blanc [phone]) on 03/28/2023 3:20:42 PM    TTE 11/09/21: IMPRESSIONS     1. Left ventricular ejection fraction, by estimation, is 55 to 60%. The  left ventricle has normal function. The left ventricle has no regional  wall motion abnormalities. Left ventricular diastolic parameters were  normal.   2. Right ventricular systolic function is normal.  The right ventricular  size is normal. There is normal pulmonary artery systolic pressure. The  estimated right ventricular systolic pressure is 30.1 mmHg.   3. The mitral valve is grossly normal. Trivial mitral valve  regurgitation. No evidence of mitral stenosis.   4. The aortic valve is tricuspid. Aortic valve regurgitation is trivial.  No aortic stenosis is present.   5. The inferior vena cava is normal in size with <50% respiratory  variability, suggesting right atrial pressure of 8 mmHg.   Conclusion(s)/Recommendation(s): Normal biventricular function without  evidence of hemodynamically significant valvular heart disease.    Cardiac Monitor 07/2020: Patch wear time was 13 days and 12 hours Predominant rhythm was NSR with average HR 74bpm, There was 1 run of nonsustained VT lasting 4 beats at a max rate of 207bpm There were 2 runs of SVT with the fastest lasting 8 beats at max rate of 160bpm Patient triggered events correlated with SVEs. Rare SVE (<1%), rare VE (<1%) No sustained arrhythmias or significant pauses     Patch Wear Time:  13 days and 12 hours (2022-05-26T16:39:17-0400 to 2022-06-09T05:31:57-0400)   Patient had a min HR of 47 bpm, max HR of 207 bpm, and avg HR of 74 bpm. Predominant underlying rhythm was Sinus Rhythm. 1 run of Ventricular Tachycardia occurred lasting 4 beats with a max rate of 207 bpm (avg 195 bpm). 2 Supraventricular Tachycardia  runs occurred, the run with the fastest interval lasting 8 beats with a max rate of 160 bpm, the longest lasting 5 beats with an avg rate of 101 bpm. Isolated SVEs were rare (<1.0%), SVE Couplets were rare (<1.0%), and SVE Triplets were rare (<1.0%).  Isolated VEs were rare (<1.0%), VE Couplets were rare (<1.0%), and no VE Triplets were present. Ventricular Trigeminy was present.    Antonio Sorrow, MD      Physical Exam:   VS:  BP 110/62 (BP Location: Left Arm)   Pulse 62   Ht 5' 8 (1.727 m)   Wt 170 lb (77.1 kg)    SpO2 96%   BMI 25.85 kg/m    Wt Readings from Last 3 Encounters:  03/28/23 170 lb (77.1 kg)  07/26/22 164 lb (74.4 kg)  05/09/22 167 lb 8 oz (76 kg)    GEN: Well nourished, well developed in no acute distress NECK: No JVD; No carotid bruits CARDIAC: RRR, no murmurs, rubs, gallops RESPIRATORY:  Clear to auscultation without rales, wheezing or rhonchi  ABDOMEN: Soft, non-tender, non-distended EXTREMITIES:  No edema; No deformity   ASSESSMENT AND PLAN: .    Paroxysmal Ventricular Tachycardia and Supraventricular Tachycardia Recent increase in palpitations with heart rates up to 169 bpm, associated with dizziness. Episodes are sporadic and not consistently responsive to additional doses of Cardizem  or Metoprolol . -Continue Cardizem  15mg  twice daily. -Add as needed dose of Cardizem  15mg  for episodes of palpitations. -Consider referral to electrophysiology for further evaluation if episodes become more frequent or last longer.      Dispo: He can establish care with Dr. Raford in 6 months  Signed, Blanc LOISE Lucien, PA-C

## 2023-03-28 NOTE — Patient Instructions (Signed)
 Medication Instructions:  Your physician has recommended you make the following change in your medication:   1) diltiazem  (Cardizem ) 15 mg twice daily, may take an additional 15mg  (half tablet) as needed for palpitations.  *If you need a refill on your cardiac medications before your next appointment, please call your pharmacy*  Lab Work: None ordered today. If you have labs (blood work) drawn today and your tests are completely normal, you will receive your results only by: MyChart Message (if you have MyChart) OR A paper copy in the mail If you have any lab test that is abnormal or we need to change your treatment, we will call you to review the results.   Testing/Procedures: None ordered today.  Follow-Up: At [facility], you and your health needs are our priority.  As part of our continuing mission to provide you with exceptional heart care, we have created designated Provider Care Teams.  These Care Teams include your primary Cardiologist (physician) and Advanced Practice Providers (APPs -  Physician Assistants and Nurse Practitioners) who all work together to provide you with the care you need, when you need it.  Your next appointment:   6 month(s)  Provider:   Annabella Scarce, MD  Other Instructions       1st Floor: - Lobby - Registration  - Pharmacy  - Lab - Cafe  2nd Floor: - PV Lab - Diagnostic Testing (echo, CT, nuclear med)  3rd Floor: - Vacant  4th Floor: - TCTS (cardiothoracic surgery) - AFib Clinic - Structural Heart Clinic - Vascular Surgery  - Vascular Ultrasound  5th Floor: - HeartCare Cardiology (general and EP) - Clinical Pharmacy for coumadin, hypertension, lipid, weight-loss medications, and med management appointments    Valet parking services will be available as well.

## 2023-05-14 ENCOUNTER — Other Ambulatory Visit: Payer: Self-pay | Admitting: Physician Assistant

## 2023-05-15 ENCOUNTER — Other Ambulatory Visit: Payer: Self-pay | Admitting: Physician Assistant

## 2023-05-15 NOTE — Telephone Encounter (Signed)
 Left message on voicemail to call office. Need to know what other pharmacy I can send Maxalt too?

## 2023-05-15 NOTE — Telephone Encounter (Signed)
 Antonio Santiago okay to change to ODT, regular tablet is on backorder?

## 2023-05-16 MED ORDER — RIZATRIPTAN BENZOATE 10 MG PO TBDP: 10.0000 mg | ORAL_TABLET | ORAL | 0 refills | Status: DC | PRN

## 2023-05-31 ENCOUNTER — Ambulatory Visit: Admitting: Physician Assistant

## 2023-05-31 ENCOUNTER — Encounter: Payer: Self-pay | Admitting: Physician Assistant

## 2023-05-31 ENCOUNTER — Ambulatory Visit (INDEPENDENT_AMBULATORY_CARE_PROVIDER_SITE_OTHER): Admitting: Physician Assistant

## 2023-05-31 VITALS — BP 124/80 | HR 76 | Temp 98.4°F | Ht 68.0 in | Wt 163.0 lb

## 2023-05-31 DIAGNOSIS — E559 Vitamin D deficiency, unspecified: Secondary | ICD-10-CM | POA: Diagnosis not present

## 2023-05-31 DIAGNOSIS — F4323 Adjustment disorder with mixed anxiety and depressed mood: Secondary | ICD-10-CM | POA: Diagnosis not present

## 2023-05-31 DIAGNOSIS — R109 Unspecified abdominal pain: Secondary | ICD-10-CM | POA: Diagnosis not present

## 2023-05-31 LAB — TSH: TSH: 0.72 u[IU]/mL (ref 0.35–5.50)

## 2023-05-31 LAB — CBC WITH DIFFERENTIAL/PLATELET
Basophils Absolute: 0 10*3/uL (ref 0.0–0.1)
Basophils Relative: 0.4 % (ref 0.0–3.0)
Eosinophils Absolute: 0 10*3/uL (ref 0.0–0.7)
Eosinophils Relative: 0.1 % (ref 0.0–5.0)
HCT: 45.5 % (ref 39.0–52.0)
Hemoglobin: 15.3 g/dL (ref 13.0–17.0)
Lymphocytes Relative: 19.8 % (ref 12.0–46.0)
Lymphs Abs: 1.3 10*3/uL (ref 0.7–4.0)
MCHC: 33.7 g/dL (ref 30.0–36.0)
MCV: 92.1 fl (ref 78.0–100.0)
Monocytes Absolute: 0.6 10*3/uL (ref 0.1–1.0)
Monocytes Relative: 8.1 % (ref 3.0–12.0)
Neutro Abs: 4.9 10*3/uL (ref 1.4–7.7)
Neutrophils Relative %: 71.6 % (ref 43.0–77.0)
Platelets: 242 10*3/uL (ref 150.0–400.0)
RBC: 4.94 Mil/uL (ref 4.22–5.81)
RDW: 13.7 % (ref 11.5–15.5)
WBC: 6.8 10*3/uL (ref 4.0–10.5)

## 2023-05-31 LAB — COMPREHENSIVE METABOLIC PANEL WITH GFR
ALT: 13 U/L (ref 0–53)
AST: 13 U/L (ref 0–37)
Albumin: 4.5 g/dL (ref 3.5–5.2)
Alkaline Phosphatase: 55 U/L (ref 39–117)
BUN: 10 mg/dL (ref 6–23)
CO2: 29 meq/L (ref 19–32)
Calcium: 9 mg/dL (ref 8.4–10.5)
Chloride: 105 meq/L (ref 96–112)
Creatinine, Ser: 0.77 mg/dL (ref 0.40–1.50)
GFR: 107.11 mL/min (ref >60.00)
Glucose, Bld: 106 mg/dL — ABNORMAL HIGH (ref 70–99)
Potassium: 4.2 meq/L (ref 3.5–5.1)
Sodium: 141 meq/L (ref 135–145)
Total Bilirubin: 0.6 mg/dL (ref 0.2–1.2)
Total Protein: 7 g/dL (ref 6.0–8.3)

## 2023-05-31 LAB — VITAMIN B12: Vitamin B-12: 310 pg/mL (ref 211–911)

## 2023-05-31 LAB — VITAMIN D 25 HYDROXY (VIT D DEFICIENCY, FRACTURES): VITD: 26.9 ng/mL — ABNORMAL LOW (ref 30.00–100.00)

## 2023-05-31 MED ORDER — DULOXETINE HCL 20 MG PO CPEP: 20.0000 mg | ORAL_CAPSULE | Freq: Every day | ORAL | 3 refills | Status: DC

## 2023-05-31 MED ORDER — BUSPIRONE HCL 5 MG PO TABS: 5.0000 mg | ORAL_TABLET | Freq: Three times a day (TID) | ORAL | 0 refills | Status: AC | PRN

## 2023-05-31 NOTE — Progress Notes (Signed)
 Antonio Santiago is a 48 y.o. male here for a new problem.  History of Present Illness:   Chief Complaint  Patient presents with   Anxiety/Depression    Pt c/o not feeling well the past week, feeling tired, down, not sure what is wrong.   Abdominal Pain    Pt c/o having diarrhea and abdominal pain after eating anything with flour in. Pt would like Celiac testing.    HPI - Pt is accompanied by his wife.   Anxiety / Depression: Pt complains of racing thoughts, physical/mental fatigue and exhaustion, waking up tired, with symptoms worsening in the last week.  Has had negative thoughts, drained energy, starting his day feeling "very bad".  Eating and drinking has become unpleasant for him.  States these feelings/symptoms occur in the mornings and tend to improve around lunchtime.  He often has anxiety that the "feelings will come back".  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  He works 7 days a week and does not usually take days off to rest.  He has been having to travel to/from [place] very frequently.  His wife reports 3 days after opening their new location, his "body gave out" and negative racing thoughts started.  He reports being unable to control or replace these negative thoughts with positive ones.  He adds that "nothing is wrong, I am happy with everything" but doesn't understand what could be causing these feelings.  He as experienced similar feelings/thoughts in the past when his father passed or when relatives have been ill.  He wakes up 2-3 times in the middle of the night, but states this is not new for him.  More recently, he has been waking up tired.  No difficulty falling asleep.  Pt expressed an interest in fighting these feelings and thoughts.    Abdominal Pain: Pt complains of abdominal pain and diarrhea after eating anything with flour.  Has had frequent episodes of diarrhea 2-3 days this week.   He reports a hx of acid reflux, usually after drinking coffee in the mornings.  He also endorses occasional episodes of nausea.  Occasional heartburn when laying down, but none when sitting up.  He has cut back on coffee consumption, states he has not wanted to drink it lately.  Has not yet had a colonoscopy done.  He did have an endoscopy in [place] prior to coming to the [place]  Believes he took omeprazole previously for acid reflux.  He believes this may be related to his increased stress but is not sure.  Denies any vomiting or blood in stools.  Of note, his father passed away from stomach cancer.    Past Medical History:  Diagnosis Date   Migraines      Social History   Tobacco Use   Smoking status: Never    Passive exposure: Never   Smokeless tobacco: Never  Vaping Use   Vaping status: Never Used  Substance Use Topics   Alcohol use: Not Currently   Drug use: Never    Past Surgical History:  Procedure Laterality Date   NO PAST SURGERIES      Family History  Problem Relation Age of Onset   Esophageal cancer Father    Stomach cancer Father    Healthy Sister    Healthy Sister    Healthy Daughter     No Known Allergies  Current Medications:   Current Outpatient Medications:    busPIRone (  BUSPAR) 5 MG tablet, Take 1 tablet (5 mg total) by mouth 3 (three) times daily as needed (anxiety)., Disp: 30 tablet, Rfl: 0   diltiazem (CARDIZEM) 30 MG tablet, Take 0.5 tablets (15 mg total) by mouth 2 (two) times daily. May take an additional 0.5 tablets (15mg ) daily as needed for palpitations., Disp: 120 tablet, Rfl: 2   DULoxetine (CYMBALTA) 20 MG capsule, Take 1 capsule (20 mg total) by mouth daily., Disp: 30 capsule, Rfl: 3   rizatriptan (MAXALT-MLT) 10 MG disintegrating tablet, Take 1 tablet (10 mg total) by mouth as needed for migraine. May repeat in 2 hours if needed, Disp: 10 tablet, Rfl: 0   tadalafil (CIALIS) 10 MG tablet, Take 1 tablet (10 mg total) by mouth every  other day as needed for erectile dysfunction., Disp: 10 tablet, Rfl: 1   Review of Systems:   Negative unless otherwise specified per HPI.  Vitals:   Vitals:   05/31/23 0838  BP: 124/80  Pulse: 76  Temp: 98.4 F (36.9 C)  TempSrc: Temporal  SpO2: 98%  Weight: 163 lb (73.9 kg)  Height: 5\' 8"  (1.727 m)     Body mass index is 24.78 kg/m.  Physical Exam:   Physical Exam Vitals and nursing note reviewed.  Constitutional:      General: He is not in acute distress.    Appearance: He is well-developed. He is not ill-appearing or toxic-appearing.  Cardiovascular:     Rate and Rhythm: Normal rate and regular rhythm.     Pulses: Normal pulses.     Heart sounds: Normal heart sounds, S1 normal and S2 normal.  Pulmonary:     Effort: Pulmonary effort is normal.     Breath sounds: Normal breath sounds.  Skin:    General: Skin is warm and dry.  Neurological:     Mental Status: He is alert.     GCS: GCS eye subscore is 4. GCS verbal subscore is 5. GCS motor subscore is 6.  Psychiatric:        Speech: Speech normal.        Behavior: Behavior normal. Behavior is cooperative.     Assessment and Plan:   1. Abdominal pain, unspecified abdominal location (Primary) No red flags Will test for H Pylori and Celiacs per patient request Recommend considering OTC (available over the counter without a prescription) Prilosec or other medication to help with gastroesophageal reflux disease/gastritis I reminded him that he is overdue for colonoscopy and he reports that he plans to cal and schedule - Celiac Disease Ab Screen w/Rfx - Vitamin B12 - H. pylori breath test  2. Adjustment reaction with anxiety and depression Uncontrolled Denies suicidal ideation/hi Start Cymbalta 20 mg per patient's request Trial buspar 15 mg twice daily as needed for anxiety Risks and benefits/side effect(s) discussed Referral for talk therapy placed Follow up in 4-6 weeks, sooner if concerns - CBC with  Differential/Platelet - Comprehensive metabolic panel with GFR - TSH - Vitamin B12  3. Vitamin D deficiency - VITAMIN D 25 Hydroxy (Vit-D Deficiency, Fractures)    I, Isabelle Course, acting as a Neurosurgeon for [employer], [place]., have documented all relevant documentation on the behalf of Jarold Motto, [place], as directed by  Jarold Motto, PA while in the presence of Jarold Motto, [place].  I, Jarold Motto, [place], have reviewed all documentation for this visit. The documentation on 05/31/23 for the exam, diagnosis, procedures, and orders are all accurate and complete.  Jarold Motto, PA-C

## 2023-05-31 NOTE — Patient Instructions (Signed)
 It was great to see you!  Start Cymbalta 20 mg daily  Take buspirone 5 mg three times daily as needed for anxiety  We will do additional testing today and I will let you know results  Let's follow-up in 1-2 months, sooner if you have concerns.  Take care,  Jarold Motto PA-C

## 2023-06-01 ENCOUNTER — Encounter: Payer: Self-pay | Admitting: Physician Assistant

## 2023-06-01 ENCOUNTER — Ambulatory Visit (INDEPENDENT_AMBULATORY_CARE_PROVIDER_SITE_OTHER): Admitting: Physician Assistant

## 2023-06-01 DIAGNOSIS — E538 Deficiency of other specified B group vitamins: Secondary | ICD-10-CM | POA: Diagnosis not present

## 2023-06-01 LAB — H. PYLORI BREATH TEST: H. pylori Breath Test: NOT DETECTED

## 2023-06-01 MED ORDER — CYANOCOBALAMIN 1000 MCG/ML IJ SOLN
1000.0000 ug | Freq: Once | INTRAMUSCULAR | Status: AC
  Administered 2023-06-01: 1000 ug via INTRAMUSCULAR

## 2023-06-01 NOTE — Progress Notes (Signed)
.  Patient is in office today for a nurse visit for B12 Injection. Patient Injection was given in the  Right deltoid. Patient tolerated injection well. Per Bufford Buttner.

## 2023-06-02 ENCOUNTER — Other Ambulatory Visit: Payer: Self-pay | Admitting: *Deleted

## 2023-06-02 MED ORDER — RIZATRIPTAN BENZOATE 10 MG PO TBDP: 10.0000 mg | ORAL_TABLET | ORAL | 2 refills | Status: DC | PRN

## 2023-06-04 LAB — CELIAC DISEASE AB SCREEN W/RFX
Antigliadin Abs, IgA: 3 U (ref 0–19)
IgA/Immunoglobulin A, Serum: 150 mg/dL (ref 90–386)
Transglutaminase IgA: <2 U/mL (ref 0–3)

## 2023-06-05 ENCOUNTER — Ambulatory Visit (INDEPENDENT_AMBULATORY_CARE_PROVIDER_SITE_OTHER): Admitting: Behavioral Health

## 2023-06-05 DIAGNOSIS — F411 Generalized anxiety disorder: Secondary | ICD-10-CM

## 2023-06-05 DIAGNOSIS — F430 Acute stress reaction: Secondary | ICD-10-CM | POA: Diagnosis not present

## 2023-06-05 NOTE — Progress Notes (Deleted)
 [facility] Counselor/Therapist Progress Note  Patient ID: Quintrell Baze, MRN: [id number],    Date: 06/05/2023  Time Spent: ***   Treatment Type: {CHL AMB THERAPY [phone]}  Reported Symptoms: ***  Mental Status Exam: Appearance:  {PSY:22683}     Behavior: {PSY:21022743}  Motor: {PSY:22302}  Speech/Language:  {PSY:22685}  Affect: {PSY:22687}  Mood: {PSY:31886}  Thought process: {PSY:31888}  Thought content:   {PSY:3676883950}  Sensory/Perceptual disturbances:   {PSY:616-778-4230}  Orientation: {PSY:30297}  Attention: {PSY:22877}  Concentration: {PSY:660-714-5189}  Memory: {[phone]}  Fund of knowledge:  {PSY:660-714-5189}  Insight:   {PSY:660-714-5189}  Judgment:  {PSY:660-714-5189}  Impulse Control: {PSY:660-714-5189}   Risk Assessment: Danger to Self:  {PSY:22692} Self-injurious Behavior: {PSY:22692} Danger to Others: {PSY:22692} Duty to Warn:{PSY:311194} Physical Aggression / Violence:{PSY:21197} Access to Firearms a concern: {PSY:21197} Gang Involvement:{PSY:21197}  Subjective: ***   Interventions: {PSY:514-325-2821}  Diagnosis:No diagnosis found.  Plan: ***  Elroy Hallmark, LMFT

## 2023-06-05 NOTE — Progress Notes (Signed)
   Deneise Lever, LMFT

## 2023-06-08 ENCOUNTER — Ambulatory Visit (INDEPENDENT_AMBULATORY_CARE_PROVIDER_SITE_OTHER): Admitting: *Deleted

## 2023-06-08 DIAGNOSIS — E538 Deficiency of other specified B group vitamins: Secondary | ICD-10-CM | POA: Diagnosis not present

## 2023-06-08 MED ORDER — CYANOCOBALAMIN 1000 MCG/ML IJ SOLN
1000.0000 ug | Freq: Once | INTRAMUSCULAR | Status: AC
  Administered 2023-06-08: 1000 ug via INTRAMUSCULAR

## 2023-06-08 NOTE — Progress Notes (Signed)
 Patient presents for B12 injection today. Patient received her B12 injection in Right Deltoid. Patient tolerated injection well.  Documentation entered in [facility] in [employer].

## 2023-06-19 ENCOUNTER — Ambulatory Visit (INDEPENDENT_AMBULATORY_CARE_PROVIDER_SITE_OTHER): Admitting: *Deleted

## 2023-06-19 DIAGNOSIS — E538 Deficiency of other specified B group vitamins: Secondary | ICD-10-CM

## 2023-06-19 MED ORDER — CYANOCOBALAMIN 1000 MCG/ML IJ SOLN
1000.0000 ug | Freq: Once | INTRAMUSCULAR | Status: AC
  Administered 2023-06-19: 1000 ug via INTRAMUSCULAR

## 2023-06-19 NOTE — Progress Notes (Signed)
 Patient presents for B12 injection today. Patient received her B12 injection in Right Deltoid. Patient tolerated injection well.  Documentation entered in [facility] in [employer].

## 2023-06-22 ENCOUNTER — Other Ambulatory Visit: Payer: Self-pay | Admitting: Physician Assistant

## 2023-06-27 ENCOUNTER — Ambulatory Visit (INDEPENDENT_AMBULATORY_CARE_PROVIDER_SITE_OTHER)

## 2023-06-27 DIAGNOSIS — E538 Deficiency of other specified B group vitamins: Secondary | ICD-10-CM

## 2023-06-27 MED ORDER — CYANOCOBALAMIN 1000 MCG/ML IJ SOLN
1000.0000 ug | Freq: Once | INTRAMUSCULAR | Status: AC
Start: 2023-06-27 — End: 2023-06-27
  Administered 2023-06-27: 1000 ug via INTRAMUSCULAR

## 2023-06-27 NOTE — Progress Notes (Signed)
 Patient is in office today for a nurse visit for B12 Injection, per PCP's order. Patient Injection was given in the  Right deltoid. Patient tolerated injection well.

## 2023-07-04 ENCOUNTER — Ambulatory Visit: Admitting: Behavioral Health

## 2023-07-27 ENCOUNTER — Encounter: Payer: Self-pay | Admitting: Physician Assistant

## 2023-07-27 ENCOUNTER — Ambulatory Visit (INDEPENDENT_AMBULATORY_CARE_PROVIDER_SITE_OTHER): Admitting: Physician Assistant

## 2023-07-27 VITALS — BP 108/66 | HR 77 | Temp 98.1°F | Ht 68.0 in | Wt 165.0 lb

## 2023-07-27 DIAGNOSIS — E538 Deficiency of other specified B group vitamins: Secondary | ICD-10-CM | POA: Diagnosis not present

## 2023-07-27 DIAGNOSIS — E559 Vitamin D deficiency, unspecified: Secondary | ICD-10-CM

## 2023-07-27 DIAGNOSIS — R000 Tachycardia, unspecified: Secondary | ICD-10-CM

## 2023-07-27 LAB — CBC WITH DIFFERENTIAL/PLATELET
Basophils Absolute: 0.1 10*3/uL (ref 0.0–0.1)
Basophils Relative: 0.9 % (ref 0.0–3.0)
Eosinophils Absolute: 0 10*3/uL (ref 0.0–0.7)
Eosinophils Relative: 0.6 % (ref 0.0–5.0)
HCT: 45 % (ref 39.0–52.0)
Hemoglobin: 15.1 g/dL (ref 13.0–17.0)
Lymphocytes Relative: 29.9 % (ref 12.0–46.0)
Lymphs Abs: 2 10*3/uL (ref 0.7–4.0)
MCHC: 33.7 g/dL (ref 30.0–36.0)
MCV: 90.3 fl (ref 78.0–100.0)
Monocytes Absolute: 0.5 10*3/uL (ref 0.1–1.0)
Monocytes Relative: 7.1 % (ref 3.0–12.0)
Neutro Abs: 4.1 10*3/uL (ref 1.4–7.7)
Neutrophils Relative %: 61.5 % (ref 43.0–77.0)
Platelets: 279 10*3/uL (ref 150.0–400.0)
RBC: 4.98 Mil/uL (ref 4.22–5.81)
RDW: 13.2 % (ref 11.5–15.5)
WBC: 6.7 10*3/uL (ref 4.0–10.5)

## 2023-07-27 LAB — TSH: TSH: 0.58 u[IU]/mL (ref 0.35–5.50)

## 2023-07-27 LAB — VITAMIN D 25 HYDROXY (VIT D DEFICIENCY, FRACTURES): VITD: 36.4 ng/mL (ref 30.00–100.00)

## 2023-07-27 MED ORDER — CYANOCOBALAMIN 1000 MCG/ML IJ SOLN
1000.0000 ug | Freq: Once | INTRAMUSCULAR | Status: AC
  Administered 2023-07-27: 1000 ug via INTRAMUSCULAR

## 2023-07-27 NOTE — Progress Notes (Signed)
.  Patient is in office today for a nurse visit for B12 Injection per Ova Bloomer. Patient Injection was given in the  Right deltoid. Patient tolerated injection well.

## 2023-07-27 NOTE — Progress Notes (Signed)
 Antonio Santiago is a 48 y.o. male here for a new problem.  History of Present Illness:   Chief Complaint  Patient presents with   Palpitations    Possible reaction to Vit D    Palpitations: Pt complains of palpitations after taking vitamin D .  Associated symptoms include  He monitors his HR and reports readings as high as 172.  These episodes typically occurred about 1-2 days after consistently taking vitamin D .  He was previously taking 1000-2000 units of OTC vitamin D  daily with food.  He stopped vitamin D  supplementation about 20 days ago.  He is compliant with Diltiazem  15 mg BID and also took a half tablet during these episodes.  Denies any chest pain or diaphoresis.   Anxiety / Depression : Moods have improved and are currently well controlled.  When  rereading the journal entries he wrote when experiencing anxiety/depression he feels he was a different person, stating "that was not me". He did not pick up Buspar  and did not take.  Denies any SI/HI today.   Past Medical History:  Diagnosis Date   Migraines      Social History   Tobacco Use   Smoking status: Never    Passive exposure: Never   Smokeless tobacco: Never  Vaping Use   Vaping status: Never Used  Substance Use Topics   Alcohol use: Not Currently   Drug use: Never    Past Surgical History:  Procedure Laterality Date   NO PAST SURGERIES      Family History  Problem Relation Age of Onset   Esophageal cancer Father    Stomach cancer Father    Healthy Sister    Healthy Sister    Healthy Daughter     No Known Allergies  Current Medications:   Current Outpatient Medications:    busPIRone  (BUSPAR ) 5 MG tablet, Take 1 tablet (5 mg total) by mouth 3 (three) times daily as needed (anxiety)., Disp: 30 tablet, Rfl: 0   diltiazem  (CARDIZEM ) 30 MG tablet, Take 0.5 tablets (15 mg total) by mouth 2 (two) times daily. May take an additional 0.5 tablets (15mg ) daily as needed for palpitations., Disp: 120  tablet, Rfl: 2   rizatriptan  (MAXALT -MLT) 10 MG disintegrating tablet, Take 1 tablet (10 mg total) by mouth as needed for migraine. May repeat in 2 hours if needed, Disp: 10 tablet, Rfl: 2   tadalafil  (CIALIS ) 10 MG tablet, Take 1 tablet (10 mg total) by mouth every other day as needed for erectile dysfunction., Disp: 10 tablet, Rfl: 1   Review of Systems:   Negative unless otherwise specified per HPI.  Vitals:   Vitals:   07/27/23 1018  BP: 108/66  Pulse: 77  Temp: 98.1 F (36.7 C)  TempSrc: Temporal  SpO2: 97%  Weight: 165 lb (74.8 kg)  Height: 5\' 8"  (1.727 m)     Body mass index is 25.09 kg/m.  Physical Exam:   Physical Exam Vitals and nursing note reviewed.  Constitutional:      General: He is not in acute distress.    Appearance: He is well-developed. He is not ill-appearing or toxic-appearing.  Cardiovascular:     Rate and Rhythm: Normal rate and regular rhythm.     Pulses: Normal pulses.     Heart sounds: Normal heart sounds, S1 normal and S2 normal.  Pulmonary:     Effort: Pulmonary effort is normal.     Breath sounds: Normal breath sounds.  Skin:    General: Skin is warm  and dry.  Neurological:     Mental Status: He is alert.     GCS: GCS eye subscore is 4. GCS verbal subscore is 5. GCS motor subscore is 6.  Psychiatric:        Speech: Speech normal.        Behavior: Behavior normal. Behavior is cooperative.     Assessment and Plan:   1. Tachycardia (Primary) - CBC with Differential/Platelet - Comprehensive metabolic panel with GFR - TSH  No red flags Unclear if this is related to vitamin D   Agree with continuing to hold Update blood work today If returns, recommend that he reach out to his cardiologist for ongoing evaluation  2. Vitamin D  deficiency - VITAMIN D  25 Hydroxy (Vit-D Deficiency, Fractures)  3. B12 deficiency - cyanocobalamin  (VITAMIN B12) injection 1,000 mcg   Received B-12 injection today  I, Bernita Bristle, acting as a  Neurosurgeon for [employer], [place]., have documented all relevant documentation on the behalf of Antonio Santiago, [place], as directed by   while in the presence of Antonio Santiago, [place].  I, Antonio Santiago, [place], have reviewed all documentation for this visit. The documentation on 07/27/23 for the exam, diagnosis, procedures, and orders are all accurate and complete.  Antonio Iba, PA-C

## 2023-07-28 ENCOUNTER — Ambulatory Visit: Payer: Self-pay | Admitting: Physician Assistant

## 2023-07-28 LAB — COMPREHENSIVE METABOLIC PANEL WITH GFR
ALT: 28 U/L (ref 0–53)
AST: 23 U/L (ref 0–37)
Albumin: 4.4 g/dL (ref 3.5–5.2)
Alkaline Phosphatase: 60 U/L (ref 39–117)
BUN: 14 mg/dL (ref 6–23)
CO2: 25 meq/L (ref 19–32)
Calcium: 9.3 mg/dL (ref 8.4–10.5)
Chloride: 106 meq/L (ref 96–112)
Creatinine, Ser: 0.79 mg/dL (ref 0.40–1.50)
GFR: 106.16 mL/min (ref >60.00)
Glucose, Bld: 102 mg/dL — ABNORMAL HIGH (ref 70–99)
Potassium: 3.9 meq/L (ref 3.5–5.1)
Sodium: 143 meq/L (ref 135–145)
Total Bilirubin: 0.7 mg/dL (ref 0.2–1.2)
Total Protein: 7.1 g/dL (ref 6.0–8.3)

## 2023-08-24 ENCOUNTER — Ambulatory Visit

## 2023-08-24 ENCOUNTER — Ambulatory Visit: Admitting: *Deleted

## 2023-08-24 DIAGNOSIS — E538 Deficiency of other specified B group vitamins: Secondary | ICD-10-CM

## 2023-08-24 MED ORDER — CYANOCOBALAMIN 1000 MCG/ML IJ SOLN
1000.0000 ug | Freq: Once | INTRAMUSCULAR | Status: AC
  Administered 2023-08-24: 1000 ug via INTRAMUSCULAR

## 2023-08-24 NOTE — Progress Notes (Signed)
 Per orders of Dr. Kennyth, injection of Cyanocobalamin  1000 mcg given IM by Arland Chute, LPN in left deltoid. Patient tolerated injection well. Patient will make appointment for 1 month.

## 2023-08-24 NOTE — Progress Notes (Signed)
 I have reviewed the patient's encounter and agree with the documentation.  Worth HERO. Kennyth, MD 08/24/2023 2:22 PM

## 2023-09-20 NOTE — Progress Notes (Signed)
 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    Mental Status Exam: Appearance:   Casual     Behavior:  Appropriate and Sharing  Motor:  Normal  Speech/Language:   Clear and Coherent  Affect:  Appropriate  Mood:  anxious  Thought process:  normal  Thought content:    Rumination  Sensory/Perceptual disturbances:    WNL  Orientation:  oriented to person, place, time/date, and situation  Attention:  Good  Concentration:  Good  Memory:  WNL  Fund of knowledge:   Good  Insight:    Good  Judgment:   Good  Impulse Control:  Good    Risk Assessment: Danger to Self:  No Self-injurious Behavior: No Danger to Others: No Duty to Warn:no Physical Aggression / Violence:No  Access to Firearms a concern: No  Gang Involvement:No  Patient / guardian was educated about steps to take if suicide or homicide risk level increases between visits: yes; appropriate to ICD process While future psychiatric events cannot be accurately predicted, the patient does not currently require acute inpatient psychiatric care and does not currently meet [place]  involuntary commitment criteria.  Substance Abuse History: Current substance abuse: No     Past Psychiatric History:   No previous psychological problems have been observed Outpatient Providers: Antonio Job, PA-C History of Psych Hospitalization: No  Psychological Testing: NA   Abuse History:  Victim of: No., NA   Report needed: No. Victim of Neglect:No. 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  Recreation/Hobbies: Pt walks for 1 1/2 hrs/day. He completes 20-50K worth of steps daily. He enjoys being active when he can fit it into a busy schedule.  Stressors: Other: 2 Locations for the [employer]; [place]    Strengths: Supportive Relationships, Family, Friends, Church, Hopefulness, Journalist, newspaper, and Able to Communicate Effectively  Barriers:  None noted today   Legal History: Pending legal issue / charges: The patient has no significant history of legal issues. History of legal issue / charges: NA  Medical History/Surgical History: reviewed Past Medical History:  Diagnosis Date   Migraines     Past Surgical History:  Procedure Laterality Date   NO PAST SURGERIES      Medications: Current Outpatient Medications  Medication Sig Dispense Refill   busPIRone  (BUSPAR ) 5 MG tablet Take 1 tablet (5 mg total) by mouth 3 (three) times daily as needed (anxiety). 30 tablet 0   diltiazem  (CARDIZEM ) 30 MG  tablet Take 0.5 tablets (15 mg total) by mouth 2 (two) times daily. May take an additional 0.5 tablets (15mg ) daily as needed for palpitations. 120 tablet 2   DULoxetine  (CYMBALTA ) 20 MG capsule Take 20 mg by mouth daily.     rizatriptan  (MAXALT -MLT) 10 MG disintegrating tablet Take 1 tablet (10 mg total) by mouth as needed for migraine. May repeat in 2 hours if needed 10 tablet 2   tadalafil  (CIALIS ) 10 MG tablet Take 1 tablet (10 mg total) by mouth every other day as needed for erectile dysfunction. 10 tablet 1   No current facility-administered medications for this visit.    No Known Allergies  Diagnoses:  Stress reaction  Anxiety state  Plan of Care: [name] will attend all sessions as scheduled every 2-3 wks. He will track his progress to improve his anxiety & get more rest. Pt will implement the suggestions made in session toda7 & determine what works best for his situation. He will report back next visit.  Target Date: 07/06/2023  Progress: 6  Frequency: Once every 2-3 wks or as Business allows  Modality: Antonio Richerd LITTIE Hollace, LMFT

## 2023-10-17 ENCOUNTER — Ambulatory Visit

## 2023-10-17 ENCOUNTER — Telehealth: Payer: Self-pay | Admitting: Physician Assistant

## 2023-10-17 DIAGNOSIS — E538 Deficiency of other specified B group vitamins: Secondary | ICD-10-CM | POA: Diagnosis not present

## 2023-10-17 MED ORDER — CYANOCOBALAMIN 1000 MCG/ML IJ SOLN
1000.0000 ug | Freq: Once | INTRAMUSCULAR | Status: AC
  Administered 2023-10-17: 1000 ug via INTRAMUSCULAR

## 2023-10-17 NOTE — Progress Notes (Addendum)
 Patient is in office today for a nurse visit for B12 Injection. Patient Injection was given in the  Left deltoid. Patient tolerated injection well.

## 2023-10-17 NOTE — Telephone Encounter (Signed)
 Pt completed his 3rd monthly B12. They are asking if more is needed or possible lab work. Please advise

## 2023-10-17 NOTE — Telephone Encounter (Signed)
 Antonio Santiago, he needs to schedule lab work in one month. I will place orders in Epic.

## 2023-10-27 NOTE — Telephone Encounter (Signed)
 LVM to schedule lab only appt after 11/17/23 for B12

## 2023-11-20 ENCOUNTER — Other Ambulatory Visit (INDEPENDENT_AMBULATORY_CARE_PROVIDER_SITE_OTHER)

## 2023-11-20 DIAGNOSIS — E538 Deficiency of other specified B group vitamins: Secondary | ICD-10-CM | POA: Diagnosis not present

## 2023-11-20 LAB — VITAMIN B12: Vitamin B-12: 405 pg/mL (ref 211–911)

## 2023-11-21 ENCOUNTER — Ambulatory Visit: Payer: Self-pay | Admitting: Physician Assistant

## 2023-11-21 ENCOUNTER — Encounter: Payer: Self-pay | Admitting: Physician Assistant

## 2023-11-23 ENCOUNTER — Other Ambulatory Visit: Payer: Self-pay | Admitting: Physician Assistant

## 2023-12-06 ENCOUNTER — Encounter: Payer: Self-pay | Admitting: *Deleted

## 2023-12-06 ENCOUNTER — Ambulatory Visit (INDEPENDENT_AMBULATORY_CARE_PROVIDER_SITE_OTHER): Admitting: *Deleted

## 2023-12-06 DIAGNOSIS — E538 Deficiency of other specified B group vitamins: Secondary | ICD-10-CM

## 2023-12-06 MED ORDER — CYANOCOBALAMIN 1000 MCG/ML IJ SOLN
1000.0000 ug | Freq: Once | INTRAMUSCULAR | Status: AC
  Administered 2023-12-06: 1000 ug via INTRAMUSCULAR

## 2023-12-06 NOTE — Progress Notes (Signed)
Per orders of Jarold Motto, [place], injection of Cyanocobalamin 1000 mcg given IM by Corky Mull, LPN in left deltoid. Patient tolerated injection well. Patient will make appointment for 1 month.

## 2024-01-09 ENCOUNTER — Ambulatory Visit

## 2024-01-09 ENCOUNTER — Ambulatory Visit (INDEPENDENT_AMBULATORY_CARE_PROVIDER_SITE_OTHER)

## 2024-01-09 DIAGNOSIS — E538 Deficiency of other specified B group vitamins: Secondary | ICD-10-CM

## 2024-01-09 MED ORDER — CYANOCOBALAMIN 1000 MCG/ML IJ SOLN
1000.0000 ug | Freq: Once | INTRAMUSCULAR | Status: AC
  Administered 2024-01-09: 1000 ug via INTRAMUSCULAR

## 2024-01-09 NOTE — Progress Notes (Signed)
 Patient is in office today for a nurse visit for B12 Injection. Patient Injection was given in the  Left deltoid. Patient tolerated injection well.

## 2024-01-11 ENCOUNTER — Other Ambulatory Visit: Payer: Self-pay | Admitting: Physician Assistant

## 2024-01-23 ENCOUNTER — Encounter: Payer: Self-pay | Admitting: *Deleted

## 2024-01-23 ENCOUNTER — Telehealth: Payer: Self-pay | Admitting: Physician Assistant

## 2024-01-23 NOTE — Telephone Encounter (Signed)
 Spoke to patient and he reports that he had 4 episodes in November with the most recent being 3 days ago. Pt admits that he is experiencing shortness of breath and fatigue with episodes of rapid heart rate. Admits heart rates have reach as high has 170's. Appt made for tomorrow with Dr. Kriste to establish with another doctor in the practice and discuss rates. ER precautions reviewed with the patient.

## 2024-01-23 NOTE — Telephone Encounter (Signed)
  Per MyChart scheduling message:  STAT if HR is under 50 or over 120  (normal HR is 60-100 beats per minute)  What is your heart rate?   Do you have a log of your heart rate readings (document readings)?   Do you have any other symptoms?    Im still having the episode of the fast heart rate. Novembre i had 4 time highs heart rate from 143 to 172

## 2024-01-24 ENCOUNTER — Ambulatory Visit: Admitting: Internal Medicine

## 2024-01-25 ENCOUNTER — Telehealth (HOSPITAL_BASED_OUTPATIENT_CLINIC_OR_DEPARTMENT_OTHER): Payer: Self-pay | Admitting: Cardiovascular Disease

## 2024-01-25 NOTE — Telephone Encounter (Signed)
 Per conversation with scheduling, the patient did not request a call back or to speak w a nurse.  They called 01/23/24 and was scheduled with Dr. Kriste 12/3 for same symptoms but pt cancelled this.    Calling today with same symptoms, per scheduler patient's wife is requesting patient to see Dr. Raford and was okay w him waiting until March.  Did not need a call back but wanted to make Dr. Raford aware.

## 2024-01-25 NOTE — Telephone Encounter (Signed)
 STAT if HR is under 50 or over 120 (normal HR is 60-100 beats per minute)  What is your heart rate? N/A does not write down his heart rate readings, advised to start writing those down.   Do you have a log of your heart rate readings (document readings)? 160's-170's  Do you have any other symptoms? States that it feels like he has small balloon in his chest when his heart rate goes up. States that this happens about 1-2x a week and last episode was Thanksgiving day.  He does take an additional .5mg  of his diltiazem  when this happens and it does help but they are becoming more frequent.

## 2024-02-05 ENCOUNTER — Other Ambulatory Visit: Payer: Self-pay

## 2024-02-07 MED ORDER — DILTIAZEM HCL 30 MG PO TABS: 15.0000 mg | ORAL_TABLET | Freq: Two times a day (BID) | ORAL | 0 refills | Status: DC

## 2024-02-08 ENCOUNTER — Ambulatory Visit

## 2024-02-08 DIAGNOSIS — E538 Deficiency of other specified B group vitamins: Secondary | ICD-10-CM | POA: Diagnosis not present

## 2024-02-08 MED ORDER — CYANOCOBALAMIN 1000 MCG/ML IJ SOLN
1000.0000 ug | Freq: Once | INTRAMUSCULAR | Status: AC
  Administered 2024-02-08: 15:00:00 1000 ug via INTRAMUSCULAR

## 2024-02-08 NOTE — Progress Notes (Signed)
 Patient is in office today for a nurse visit for B12 Injection. Patient Injection was given in the  Left deltoid. Patient tolerated injection well. No other questions or concerns were addressed during this visit.

## 2024-02-25 NOTE — Progress Notes (Unsigned)
 "   Cardiology Office Note   Date:  02/26/2024   ID:  Antonio Santiago, DOB 24-Dec-1975, MRN [id number]  PCP:  Job Lukes, PA  Cardiologist:   Annabella Scarce, MD   No chief complaint on file.    History of Present Illness: Antonio Santiago is a 49 y.o. male who presents for follow up.  He was seen 06/2020 for palpitations.  Monitor revealed 4 beats NSVT and 2 episodes of SVT lasting up to 8 beats. No interve nton occurred at that time  10/2021 he noted heart rates at home in the 130-160s.  Echo revealed LVEF 55-60% and normal diastolic function.  He lat saw Orren Fabry, NP 03/2022 and continued to have episodes.  He was started on diltiazem  and metoprolol  which helped intially but was less effective at that time.  He was given additional diltiazem  to take as needed.  Discussed the use of AI scribe software for clinical note transcription with the patient, who gave verbal consent to proceed.  History of Present Illness [name] experiences episodes of elevated heart rate, reaching up to 142 bpm, previously as high as 172 bpm before starting diltiazem . These episodes occur randomly, lasting from 20 minutes to two hours, and have occurred four to five times in the past three months. He can start without any specific trigger, occurring while driving or during conversation. He experiences a sensation of his heart racing, mild shortness of breath, and a feeling of tiredness during these episodes.  He is currently taking diltiazem , 15 in the morning and 15mg  in the evening, with an additional 15mg  as needed during episodes. The medication helps reduce the duration of the episodes, sometimes resolving them within 10 to 20 minutes. Prior monitors revealed brief episodes of SVT.  He has an Apple watch but hasn't captured the episodes.   He consumes two espressos daily, one in the morning and one by 9 AM, and has not noticed any correlation between caffeine intake and his symptoms. He has started  exercising, including walking in the park for one to two hours twice a week, and reports feeling good during physical activity. He also mentions experiencing some back pain, which he manages with exercise.  During the review of symptoms, he reports occasional shortness of breath, particularly when climbing stairs or after eating. 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   Past Medical History:  Diagnosis Date   Chest pain of uncertain etiology 02/26/2024   Migraines    SVT (supraventricular tachycardia) 02/26/2024    Past Surgical History:  Procedure Laterality Date   NO PAST SURGERIES       Current Outpatient Medications  Medication Sig Dispense Refill   busPIRone  (BUSPAR ) 5 MG tablet Take 1 tablet (5 mg total) by mouth 3 (three) times daily as needed (anxiety). 30 tablet 0   diltiazem  (CARDIZEM ) 30 MG tablet Take 1 tablet (30 mg total) by mouth 2 (two) times daily. May take an additional tablet (30 mg total) by mouth daily as needed for palpitations. 180 tablet 2   DULoxetine  (CYMBALTA ) 20 MG capsule Take 20 mg by mouth daily.     metoprolol  tartrate (LOPRESSOR ) 100 MG tablet Take 1 tablet (100 mg total) by mouth once. Take 90-120 minutes prior to scan. Hold for SBP less than 110. 1 tablet 0   rizatriptan  (MAXALT -MLT) 10  MG disintegrating tablet DISSOLVE 1 TABLET BY MOUTH AS NEEDED FOR MIGRAINE. MAY REPEAT IN 2 HOURS IF NEEDED 10 tablet 2   tadalafil  (CIALIS ) 10 MG tablet TAKE 1 TABLET BY MOUTH EVERY OTHER DAY AS NEEDED FOR ERECTILE DYSFUNCTION 10 tablet 0   No current facility-administered medications for this visit.    Allergies:   Patient has no known allergies.    Social History:  The patient  reports that he has never smoked. He has never been exposed to tobacco smoke. He has never used smokeless tobacco. He  reports that he does not currently use alcohol. He reports that he does not use drugs.   Family History:  The patient's family history includes Esophageal cancer in his father; Healthy in his daughter, sister, and sister; Stomach cancer in his father.    ROS:  Please see the history of present illness.   Otherwise, review of systems are positive for none.   All other systems are reviewed and negative.    PHYSICAL EXAM: VS:  BP 122/72 (BP Location: Left Arm, Patient Position: Sitting, Cuff Size: Normal)   Pulse 77   Ht 5' 8 (1.727 m)   Wt 169 lb (76.7 kg)   SpO2 97%   BMI 25.70 kg/m  , BMI Body mass index is 25.7 kg/m. GENERAL:  Well appearing HEENT:  Pupils equal round and reactive, fundi not visualized, oral mucosa unremarkable NECK:  No jugular venous distention, waveform within normal limits, carotid upstroke brisk and symmetric, no bruits, no thyromegaly LUNGS:  Clear to auscultation bilaterally HEART:  RRR.  PMI not displaced or sustained,S1 and S2 within normal limits, no S3, no S4, no clicks, no rubs, no murmurs ABD:  Flat, positive bowel sounds normal in frequency in pitch, no bruits, no rebound, no guarding, no midline pulsatile mass, no hepatomegaly, no splenomegaly EXT:  2 plus pulses throughout, no edema, no cyanosis no clubbing SKIN:  No rashes no nodules NEURO:  Cranial nerves II through XII grossly intact, motor grossly intact throughout PSYCH:  Cognitively intact, oriented to person place and time  EKG:  EKG is not ordered today.   Recent Labs: 07/27/2023: ALT 28; BUN 14; Creatinine, Ser 0.79; Hemoglobin 15.1; Platelets 279.0; Potassium 3.9; Sodium 143; TSH 0.58    Lipid Panel    Component Value Date/Time   CHOL 150 07/26/2022 1327   TRIG 63.0 07/26/2022 1327   HDL 49.20 07/26/2022 1327   CHOLHDL 3 07/26/2022 1327   VLDL 12.6 07/26/2022 1327   LDLCALC 88 07/26/2022 1327   LDLDIRECT 109.0 04/20/2020 1546      Wt Readings from Last 3 Encounters:  02/26/24  169 lb (76.7 kg)  07/27/23 165 lb (74.8 kg)  05/31/23 163 lb (73.9 kg)      ASSESSMENT AND PLAN:  Assessment & Plan # Supraventricular tachycardia Episodes with heart rates up to 172 bpm, lasting 20 minutes to 3 hours, occurring 4-5 times in 3 months. Current diltiazem  15 mg twice daily insufficient. Discussed increasing diltiazem  to 30 mg twice daily. Discussed potential ablation if symptoms persist or worsen. Emphasized capturing EKG during episodes. - Increased diltiazem  to 30 mg twice daily. - Instructed to capture EKG during episodes using Apple Watch. - Consider referral to electrophysiologist for ablation if symptoms persist or worsen.  # Evaluation for coronary artery disease Intermittent shortness of breath and chest pressure with exertion suggest possible coronary artery disease. No evidence of structural heart disease. Discussed need for cardiac CT with contrast. - Ordered cardiac  CT with contrast to evaluate for coronary artery disease.    Current medicines are reviewed at length with the patient today.  The patient does not have concerns regarding medicines.    Orders Placed This Encounter  Procedures   CT CORONARY MORPH W/CTA COR W/SCORE W/CA W/CM &/OR WO/CM   Basic Metabolic Panel (BMET)     Disposition:   FU with me in 2-3 months     Signed, Eliska Hamil C. Raford, MD, [facility]  02/26/2024 9:13 AM    [facility]  "

## 2024-02-26 ENCOUNTER — Encounter (HOSPITAL_BASED_OUTPATIENT_CLINIC_OR_DEPARTMENT_OTHER): Payer: Self-pay | Admitting: Cardiovascular Disease

## 2024-02-26 ENCOUNTER — Ambulatory Visit (INDEPENDENT_AMBULATORY_CARE_PROVIDER_SITE_OTHER): Admitting: Cardiovascular Disease

## 2024-02-26 VITALS — BP 122/72 | HR 77 | Ht 68.0 in | Wt 169.0 lb

## 2024-02-26 DIAGNOSIS — R079 Chest pain, unspecified: Secondary | ICD-10-CM | POA: Insufficient documentation

## 2024-02-26 DIAGNOSIS — I471 Supraventricular tachycardia, unspecified: Secondary | ICD-10-CM | POA: Insufficient documentation

## 2024-02-26 DIAGNOSIS — R072 Precordial pain: Secondary | ICD-10-CM | POA: Diagnosis not present

## 2024-02-26 LAB — BASIC METABOLIC PANEL WITH GFR
BUN/Creatinine Ratio: 14 (ref 9–20)
BUN: 12 mg/dL (ref 6–24)
CO2: 26 mmol/L (ref 20–29)
Calcium: 9.1 mg/dL (ref 8.7–10.2)
Chloride: 105 mmol/L (ref 96–106)
Creatinine, Ser: 0.86 mg/dL (ref 0.76–1.27)
Glucose: 96 mg/dL (ref 70–99)
Potassium: 4.5 mmol/L (ref 3.5–5.2)
Sodium: 143 mmol/L (ref 134–144)
eGFR: 107 mL/min/1.73

## 2024-02-26 MED ORDER — DILTIAZEM HCL 30 MG PO TABS: 30.0000 mg | ORAL_TABLET | Freq: Two times a day (BID) | ORAL | 2 refills | Status: DC

## 2024-02-26 MED ORDER — DILTIAZEM HCL 30 MG PO TABS: 30.0000 mg | ORAL_TABLET | Freq: Two times a day (BID) | ORAL | 2 refills | Status: AC

## 2024-02-26 MED ORDER — METOPROLOL TARTRATE 100 MG PO TABS: 100.0000 mg | ORAL_TABLET | Freq: Once | ORAL | 0 refills | Status: AC

## 2024-02-26 NOTE — Addendum Note (Signed)
 Addended by: GLADIS PORTER HERO on: 02/26/2024 10:11 AM   Modules accepted: Orders

## 2024-02-26 NOTE — Patient Instructions (Addendum)
 Medication Instructions:   INCREASE YOUR CARDIZEM  TO 30 MG BY MOUTH TWICE DAILY--YOU MAY TAKE AN ADDITIONAL 1/2 TABLET BY MOUTH DAILY AS NEEDED FOR BREAKTHROUGH PALPITATIONS  *If you need a refill on your cardiac medications before your next appointment, please call your pharmacy*  Lab Work:  TODAY ON THE 3RD FLOOR AT [address]--BMET  If you have labs (blood work) drawn today and your tests are completely normal, you will receive your results only by: MyChart Message (if you have MyChart) OR A paper copy in the mail If you have any lab test that is abnormal or we need to change your treatment, we will call you to review the results.   Testing/Procedures:    Your cardiac CT will be scheduled at one of the below locations:    [facility] [address]  [place], [place] [address]   If scheduled at the [facility] at [address], please enter the parking lot using the [address] entrance and use the FREE valet service at the patient drop-off area. Enter the building and check-in with registration on the main floor.    Please follow these instructions carefully (unless otherwise directed):  An IV will be required for this test and Nitroglycerin will be given.  Hold all erectile dysfunction medications at least 3 days (72 hrs) prior to test. (Ie viagra, cialis , sildenafil, tadalafil , etc)   DO NOT TAKE CIALIS  3 DAYS PRIOR TO THIS TEST  On the Night Before the Test: Be sure to Drink plenty of water. Do not consume any caffeinated/decaffeinated beverages or chocolate 12 hours prior to your test. Do not take any antihistamines 12 hours prior to your test.   On the Day of the Test: Drink plenty of water until 1 hour prior to the test. Do not eat any food 1 hour prior to test. You may take your regular medications prior to the test.  Take metoprolol  100 MG (Lopressor ) BY MOUTH two hours prior to test.        After the  Test: Drink plenty of water. After receiving IV contrast, you may experience a mild flushed feeling. This is normal. On occasion, you may experience a mild rash up to 24 hours after the test. This is not dangerous. If this occurs, you can take Benadryl 25 mg, Zyrtec, Claritin, or Allegra and increase your fluid intake. (Patients taking Tikosyn should avoid Benadryl, and may take Zyrtec, Claritin, or Allegra) If you experience trouble breathing, this can be serious. If it is severe call 911 IMMEDIATELY. If it is mild, please call our office.  We will call to schedule your test 2-4 weeks out understanding that some insurance companies will need an authorization prior to the service being performed.   For more information and frequently asked questions, please visit our website : [email or web address]  For non-scheduling related questions, please contact the cardiac imaging nurse navigator should you have any questions/concerns: Cardiac Imaging Nurse Navigators Direct Office Dial: [phone]   For scheduling needs, including cancellations and rescheduling, please call Brittany, [phone].   Follow-Up:  2-3 MONTHS WITH DR. Haakon OR AN EXTENDER

## 2024-02-29 ENCOUNTER — Ambulatory Visit: Payer: Self-pay | Admitting: Cardiovascular Disease

## 2024-03-19 ENCOUNTER — Ambulatory Visit

## 2024-03-19 DIAGNOSIS — E538 Deficiency of other specified B group vitamins: Secondary | ICD-10-CM | POA: Diagnosis not present

## 2024-03-19 MED ORDER — CYANOCOBALAMIN 1000 MCG/ML IJ SOLN
1000.0000 ug | Freq: Once | INTRAMUSCULAR | Status: AC
  Administered 2024-03-19: 1000 ug via INTRAMUSCULAR

## 2024-03-19 NOTE — Progress Notes (Signed)
 Patient presents for B12 injection today. Patient received her B12 injection in Right Deltoid. Patient tolerated injection well.  Documentation entered in [facility] in [employer].

## 2024-03-29 ENCOUNTER — Encounter (HOSPITAL_COMMUNITY): Payer: Self-pay

## 2024-04-02 ENCOUNTER — Ambulatory Visit (HOSPITAL_COMMUNITY)

## 2024-04-23 ENCOUNTER — Ambulatory Visit

## 2024-05-07 ENCOUNTER — Ambulatory Visit (HOSPITAL_BASED_OUTPATIENT_CLINIC_OR_DEPARTMENT_OTHER): Admitting: Cardiovascular Disease
